# Patient Record
Sex: Female | Born: 2000 | Race: White | Hispanic: No | Marital: Single | Smoking: Current every day smoker
Health system: Southern US, Community
[De-identification: ages and names within clinical notes are randomized; demographics above are authoritative.]

## PROBLEM LIST (undated history)

## (undated) DIAGNOSIS — H6690 Otitis media, unspecified, unspecified ear: Secondary | ICD-10-CM

## (undated) DIAGNOSIS — T7421XA Adult sexual abuse, confirmed, initial encounter: Secondary | ICD-10-CM

## (undated) DIAGNOSIS — N390 Urinary tract infection, site not specified: Secondary | ICD-10-CM

## (undated) DIAGNOSIS — R6250 Unspecified lack of expected normal physiological development in childhood: Secondary | ICD-10-CM

## (undated) DIAGNOSIS — F909 Attention-deficit hyperactivity disorder, unspecified type: Secondary | ICD-10-CM

## (undated) DIAGNOSIS — F319 Bipolar disorder, unspecified: Secondary | ICD-10-CM

## (undated) DIAGNOSIS — B019 Varicella without complication: Secondary | ICD-10-CM

## (undated) DIAGNOSIS — J189 Pneumonia, unspecified organism: Secondary | ICD-10-CM

## (undated) DIAGNOSIS — R45851 Suicidal ideations: Secondary | ICD-10-CM

## (undated) DIAGNOSIS — F603 Borderline personality disorder: Secondary | ICD-10-CM

## (undated) DIAGNOSIS — J45909 Unspecified asthma, uncomplicated: Secondary | ICD-10-CM

## (undated) DIAGNOSIS — K219 Gastro-esophageal reflux disease without esophagitis: Secondary | ICD-10-CM

## (undated) DIAGNOSIS — J449 Chronic obstructive pulmonary disease, unspecified: Secondary | ICD-10-CM

## (undated) DIAGNOSIS — J020 Streptococcal pharyngitis: Secondary | ICD-10-CM

## (undated) DIAGNOSIS — R45850 Homicidal ideations: Secondary | ICD-10-CM

## (undated) DIAGNOSIS — F200 Paranoid schizophrenia: Secondary | ICD-10-CM

## (undated) DIAGNOSIS — F913 Oppositional defiant disorder: Secondary | ICD-10-CM

## (undated) DIAGNOSIS — F340 Cyclothymic disorder: Secondary | ICD-10-CM

## (undated) DIAGNOSIS — F1910 Other psychoactive substance abuse, uncomplicated: Secondary | ICD-10-CM

## (undated) DIAGNOSIS — F4310 Post-traumatic stress disorder, unspecified: Secondary | ICD-10-CM

## (undated) DIAGNOSIS — F429 Obsessive-compulsive disorder, unspecified: Secondary | ICD-10-CM

---
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
## 2019-08-04 DIAGNOSIS — F1193 Opioid use, unspecified with withdrawal: Secondary | ICD-10-CM | POA: Insufficient documentation

## 2019-08-04 DIAGNOSIS — F1123 Opioid dependence with withdrawal: Secondary | ICD-10-CM | POA: Insufficient documentation

## 2019-08-04 DIAGNOSIS — F1994 Other psychoactive substance use, unspecified with psychoactive substance-induced mood disorder: Secondary | ICD-10-CM | POA: Insufficient documentation

## 2020-08-15 ENCOUNTER — Other Ambulatory Visit: Payer: Self-pay

## 2020-08-15 ENCOUNTER — Ambulatory Visit (HOSPITAL_COMMUNITY)
Admission: RE | Admit: 2020-08-15 | Discharge: 2020-08-15 | Disposition: A | Discharge status: Home / Self Care | Payer: No Typology Code available for payment source | Attending: Psychiatry | Admitting: Psychiatry

## 2020-08-15 ENCOUNTER — Encounter (HOSPITAL_COMMUNITY): Payer: Self-pay | Admitting: Emergency Medicine

## 2020-08-15 ENCOUNTER — Emergency Department (HOSPITAL_COMMUNITY)
Admission: EM | Admit: 2020-08-15 | Discharge: 2020-08-18 | Disposition: A | Discharge status: Home / Self Care | Payer: No Typology Code available for payment source | Source: Home / Self Care | Attending: Emergency Medicine | Admitting: Emergency Medicine

## 2020-08-15 DIAGNOSIS — Y901 Blood alcohol level of 20-39 mg/100 ml: Secondary | ICD-10-CM | POA: Insufficient documentation

## 2020-08-15 DIAGNOSIS — F10229 Alcohol dependence with intoxication, unspecified: Secondary | ICD-10-CM | POA: Insufficient documentation

## 2020-08-15 DIAGNOSIS — Z20822 Contact with and (suspected) exposure to covid-19: Secondary | ICD-10-CM | POA: Insufficient documentation

## 2020-08-15 DIAGNOSIS — F29 Unspecified psychosis not due to a substance or known physiological condition: Secondary | ICD-10-CM

## 2020-08-15 DIAGNOSIS — F3113 Bipolar disorder, current episode manic without psychotic features, severe: Secondary | ICD-10-CM | POA: Insufficient documentation

## 2020-08-15 DIAGNOSIS — O0281 Inappropriate change in quantitative human chorionic gonadotropin (hCG) in early pregnancy: Secondary | ICD-10-CM | POA: Insufficient documentation

## 2020-08-15 DIAGNOSIS — F1910 Other psychoactive substance abuse, uncomplicated: Secondary | ICD-10-CM | POA: Clinically undetermined

## 2020-08-15 DIAGNOSIS — F1010 Alcohol abuse, uncomplicated: Secondary | ICD-10-CM

## 2020-08-15 DIAGNOSIS — R4689 Other symptoms and signs involving appearance and behavior: Secondary | ICD-10-CM

## 2020-08-15 DIAGNOSIS — F1019 Alcohol abuse with unspecified alcohol-induced disorder: Secondary | ICD-10-CM | POA: Diagnosis present

## 2020-08-15 LAB — COMPREHENSIVE METABOLIC PANEL
ALT: 31 U/L (ref 0–44)
AST: 26 U/L (ref 15–41)
Albumin: 3.9 g/dL (ref 3.5–5.0)
Alkaline Phosphatase: 59 U/L (ref 38–126)
Anion gap: 8 (ref 5–15)
BUN: 10 mg/dL (ref 6–20)
CO2: 23 mmol/L (ref 22–32)
Calcium: 8.9 mg/dL (ref 8.9–10.3)
Chloride: 115 mmol/L — ABNORMAL HIGH (ref 98–111)
Creatinine, Ser: 0.62 mg/dL (ref 0.44–1.00)
GFR, Estimated: >60 mL/min (ref >60)
Glucose, Bld: 101 mg/dL — ABNORMAL HIGH (ref 70–99)
Potassium: 3.4 mmol/L — ABNORMAL LOW (ref 3.5–5.1)
Sodium: 146 mmol/L — ABNORMAL HIGH (ref 135–145)
Total Bilirubin: 0.5 mg/dL (ref 0.3–1.2)
Total Protein: 6.9 g/dL (ref 6.5–8.1)

## 2020-08-15 LAB — CBC WITH DIFFERENTIAL/PLATELET
Abs Immature Granulocytes: 0.02 10*3/uL (ref 0.00–0.07)
Basophils Absolute: 0 10*3/uL (ref 0.0–0.1)
Basophils Relative: 0 %
Eosinophils Absolute: 0.1 10*3/uL (ref 0.0–0.5)
Eosinophils Relative: 2 %
HCT: 36.5 % (ref 36.0–46.0)
Hemoglobin: 12.2 g/dL (ref 12.0–15.0)
Immature Granulocytes: 0 %
Lymphocytes Relative: 55 %
Lymphs Abs: 3.9 10*3/uL (ref 0.7–4.0)
MCH: 32 pg (ref 26.0–34.0)
MCHC: 33.4 g/dL (ref 30.0–36.0)
MCV: 95.8 fL (ref 80.0–100.0)
Monocytes Absolute: 0.4 10*3/uL (ref 0.1–1.0)
Monocytes Relative: 5 %
Neutro Abs: 2.7 10*3/uL (ref 1.7–7.7)
Neutrophils Relative %: 38 %
Platelets: 295 10*3/uL (ref 150–400)
RBC: 3.81 MIL/uL — ABNORMAL LOW (ref 3.87–5.11)
RDW: 13.2 % (ref 11.5–15.5)
WBC: 7.1 10*3/uL (ref 4.0–10.5)
nRBC: 0 % (ref 0.0–0.2)

## 2020-08-15 LAB — RESP PANEL BY RT-PCR (FLU A&B, COVID) ARPGX2
Influenza A by PCR: NEGATIVE
Influenza B by PCR: NEGATIVE
SARS Coronavirus 2 by RT PCR: NEGATIVE

## 2020-08-15 LAB — CBG MONITORING, ED: Glucose-Capillary: 112 mg/dL — ABNORMAL HIGH (ref 70–99)

## 2020-08-15 LAB — I-STAT BETA HCG BLOOD, ED (MC, WL, AP ONLY): I-stat hCG, quantitative: 11.2 m[IU]/mL — ABNORMAL HIGH (ref <5)

## 2020-08-15 LAB — ETHANOL: Alcohol, Ethyl (B): 31 mg/dL — ABNORMAL HIGH (ref <10)

## 2020-08-15 MED ORDER — LORAZEPAM 2 MG/ML IJ SOLN
2.0000 mg | Freq: Once | INTRAMUSCULAR | Status: AC
  Administered 2020-08-15: 2 mg via INTRAMUSCULAR
  Filled 2020-08-15: qty 1

## 2020-08-15 MED ORDER — ZIPRASIDONE MESYLATE 20 MG IM SOLR
10.0000 mg | Freq: Once | INTRAMUSCULAR | Status: AC
  Administered 2020-08-15: 10 mg via INTRAMUSCULAR
  Filled 2020-08-15: qty 20
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
## 2020-08-15 NOTE — ED Provider Notes (Signed)
[facility]-EMERGENCY DEPT Provider Note   CSN: [id number] Arrival date & time: 08/15/20  2002     History Chief Complaint  Patient presents with   Psychiatric Evaluation   Alcohol Intoxication    Porfiria Alexandra Mcgee is a 20 y.o. female.  Patient comes from behavioral health for medical clearance given concern for polysubstance abuse, aggressive and erratic behavior.  Patient denies any pain.  She does not make much sense when I ask her questions or say things that are appropriate answers.  The history is provided by the patient.  Mental Health Problem Presenting symptoms: aggressive behavior and bizarre behavior   Degree of incapacity (severity):  Moderate Timing:  Constant Progression:  Unchanged Chronicity:  New Associated symptoms: no abdominal pain and no chest pain   Risk factors: no hx of mental illness       History reviewed. No pertinent past medical history.  There are no problems to display for this patient.   History reviewed. No pertinent surgical history.   OB History   No obstetric history on file.     No family history on file.     Home Medications Prior to Admission medications   Not on File    Allergies    Patient has no allergy information on record.  Review of Systems   Review of Systems  Constitutional:  Negative for chills and fever.  HENT:  Negative for ear pain and sore throat.   Eyes:  Negative for pain and visual disturbance.  Respiratory:  Negative for cough and shortness of breath.   Cardiovascular:  Negative for chest pain and palpitations.  Gastrointestinal:  Negative for abdominal pain and vomiting.  Genitourinary:  Negative for dysuria and hematuria.  Musculoskeletal:  Negative for arthralgias and back pain.  Skin:  Negative for color change and rash.  Neurological:  Negative for seizures and syncope.  All other systems reviewed and are negative.  Physical Exam Updated Vital Signs BP 113/77    Pulse 99   Temp 98.2 F (36.8 C)   Resp 17   Ht 5\' 1"  (1.549 m)   Wt 53 kg   SpO2 97%   BMI 22.07 kg/m   Physical Exam Vitals and nursing note reviewed.  Constitutional:      General: She is not in acute distress.    Appearance: She is well-developed. She is not ill-appearing.  HENT:     Head: Normocephalic and atraumatic.     Mouth/Throat:     Mouth: Mucous membranes are moist.  Eyes:     Extraocular Movements: Extraocular movements intact.     Conjunctiva/sclera: Conjunctivae normal.     Pupils: Pupils are equal, round, and reactive to light.  Cardiovascular:     Rate and Rhythm: Normal rate and regular rhythm.     Pulses: Normal pulses.     Heart sounds: Normal heart sounds. No murmur heard. Pulmonary:     Effort: Pulmonary effort is normal. No respiratory distress.     Breath sounds: Normal breath sounds.  Abdominal:     Palpations: Abdomen is soft.     Tenderness: There is no abdominal tenderness.  Musculoskeletal:     Cervical back: Neck supple.  Skin:    General: Skin is warm and dry.     Capillary Refill: Capillary refill takes less than 2 seconds.  Neurological:     General: No focal deficit present.     Mental Status: She is alert.  Psychiatric:  Attention and Perception: She is inattentive.        Mood and Affect: Affect is labile.        Speech: Speech is rapid and pressured.        Behavior: Behavior is agitated and aggressive.        Judgment: Judgment is impulsive.    ED Results / Procedures / Treatments   Labs (all labs ordered are listed, but only abnormal results are displayed) Labs Reviewed  COMPREHENSIVE METABOLIC PANEL - Abnormal; Notable for the following components:      Result Value   Sodium 146 (*)    Potassium 3.4 (*)    Chloride 115 (*)    Glucose, Bld 101 (*)    All other components within normal limits  ETHANOL - Abnormal; Notable for the following components:   Alcohol, Ethyl (B) 31 (*)    All other components within  normal limits  CBC WITH DIFFERENTIAL/PLATELET - Abnormal; Notable for the following components:   RBC 3.81 (*)    All other components within normal limits  CBG MONITORING, ED - Abnormal; Notable for the following components:   Glucose-Capillary 112 (*)    All other components within normal limits  I-STAT BETA HCG BLOOD, ED ([facility], [facility], [facility] ONLY) - Abnormal; Notable for the following components:   I-stat hCG, quantitative 11.2 (*)    All other components within normal limits  RESP PANEL BY RT-PCR (FLU A&B, COVID) ARPGX2  RAPID URINE DRUG SCREEN, HOSP PERFORMED    EKG None  Radiology No results found.  Procedures Procedures   Medications Ordered in ED Medications  ziprasidone (GEODON) injection 10 mg (10 mg Intramuscular Given 08/15/20 2132)  LORazepam (ATIVAN) injection 2 mg (2 mg Intramuscular Given 08/15/20 2136)    ED Course  I have reviewed the triage vital signs and the nursing notes.  Pertinent labs & imaging results that were available during my care of the patient were reviewed by me and considered in my medical decision making (see chart for details).    MDM Rules/Calculators/A&P                          Marianita Elley Harp is here for medical clearance after being seen by behavioral health.  Supposedly she was brought to behavioral health from a bar.  Per behavioral health note patient was brought in by someone saying that she was intoxicated and manic and restless with paranoid ideation.  Does not make much sense when she speaks.  She says a lot of paranoid things.  She overall appears intoxicated and she is agitated and aggressive.  She states that she has been diagnosed with multiple mental health problems in the past.  Overall it is difficult to get a history or physical from her.  She has been very combative and aggressive and will give Geodon and Ativan and get medical clearance labs including head CT.  Medical screening labs are overall unremarkable.  Pending head CT  prior to medical clearance.  This chart was dictated using voice recognition software.  Despite best efforts to proofread,  errors can occur which can change the documentation meaning.   Final Clinical Impression(s) / ED Diagnoses Final diagnoses:  Alcohol abuse  Aggressive behavior  Psychosis, unspecified psychosis type [facility])    Rx / DC Orders ED Discharge Orders     None        Virgina Norfolk, DO 08/16/20 0009
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
## 2020-08-15 NOTE — ED Notes (Signed)
Pt has been changed into [place] scrubs. Pt belongings bag have been put in the cabinet she has 3 bags total one pt belonging back and tote bag and a grey back pack. Keys and tablet are in the bags. Pt was been wand down by security.
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
## 2020-08-16 ENCOUNTER — Emergency Department (HOSPITAL_COMMUNITY): Payer: No Typology Code available for payment source

## 2020-08-16 DIAGNOSIS — F1019 Alcohol abuse with unspecified alcohol-induced disorder: Secondary | ICD-10-CM | POA: Diagnosis present

## 2020-08-16 DIAGNOSIS — F1910 Other psychoactive substance abuse, uncomplicated: Secondary | ICD-10-CM | POA: Clinically undetermined

## 2020-08-16 LAB — RAPID URINE DRUG SCREEN, HOSP PERFORMED
Amphetamines: NOT DETECTED
Barbiturates: NOT DETECTED
Benzodiazepines: NOT DETECTED
Cocaine: NOT DETECTED
Opiates: NOT DETECTED
Tetrahydrocannabinol: NOT DETECTED

## 2020-08-16 LAB — HCG, QUANTITATIVE, PREGNANCY: hCG, Beta Chain, Quant, S: <1 m[IU]/mL (ref <5)

## 2020-08-16 LAB — CBG MONITORING, ED: Glucose-Capillary: 100 mg/dL — ABNORMAL HIGH (ref 70–99)

## 2020-08-16 MED ORDER — ZIPRASIDONE MESYLATE 20 MG IM SOLR
20.0000 mg | INTRAMUSCULAR | Status: DC | PRN
  Administered 2020-08-16: 20 mg via INTRAMUSCULAR
  Filled 2020-08-16: qty 20

## 2020-08-16 MED ORDER — OLANZAPINE 5 MG PO TBDP
5.0000 mg | ORAL_TABLET | Freq: Three times a day (TID) | ORAL | Status: DC | PRN
  Administered 2020-08-16: 5 mg via ORAL
  Filled 2020-08-16: qty 1

## 2020-08-16 MED ORDER — CLONAZEPAM 1 MG PO TABS
1.0000 mg | ORAL_TABLET | Freq: Two times a day (BID) | ORAL | Status: DC | PRN
  Administered 2020-08-16 – 2020-08-18 (×2): 1 mg via ORAL
  Filled 2020-08-16: qty 1
  Filled 2020-08-16: qty 2

## 2020-08-16 MED ORDER — ONDANSETRON HCL 4 MG PO TABS: 4.0000 mg | ORAL_TABLET | Freq: Three times a day (TID) | ORAL | Status: DC | PRN

## 2020-08-16 MED ORDER — LORAZEPAM 1 MG PO TABS
1.0000 mg | ORAL_TABLET | ORAL | Status: AC | PRN
  Administered 2020-08-16: 1 mg via ORAL
  Filled 2020-08-16: qty 1

## 2020-08-16 MED ORDER — OLANZAPINE 5 MG PO TBDP
5.0000 mg | ORAL_TABLET | Freq: Two times a day (BID) | ORAL | Status: DC
  Administered 2020-08-17 – 2020-08-18 (×4): 5 mg via ORAL
  Filled 2020-08-16 (×4): qty 1

## 2020-08-16 MED ORDER — LORAZEPAM 1 MG PO TABS
1.0000 mg | ORAL_TABLET | ORAL | Status: AC | PRN
  Administered 2020-08-17: 1 mg via ORAL
  Filled 2020-08-16: qty 1

## 2020-08-16 MED ORDER — NICOTINE 21 MG/24HR TD PT24
21.0000 mg | MEDICATED_PATCH | Freq: Every day | TRANSDERMAL | Status: DC
  Administered 2020-08-16 – 2020-08-18 (×3): 21 mg via TRANSDERMAL
  Filled 2020-08-16 (×3): qty 1

## 2020-08-16 MED ORDER — OLANZAPINE 5 MG PO TBDP: 5.0000 mg | ORAL_TABLET | Freq: Three times a day (TID) | ORAL | Status: DC | PRN

## 2020-08-16 MED ORDER — ALUM & MAG HYDROXIDE-SIMETH 200-200-20 MG/5ML PO SUSP: 30.0000 mL | Freq: Four times a day (QID) | ORAL | Status: DC | PRN

## 2020-08-16 MED ORDER — GABAPENTIN 300 MG PO CAPS
300.0000 mg | ORAL_CAPSULE | Freq: Three times a day (TID) | ORAL | Status: DC
  Administered 2020-08-16 – 2020-08-18 (×6): 300 mg via ORAL
  Filled 2020-08-16 (×6): qty 1

## 2020-08-16 MED ORDER — ACETAMINOPHEN 325 MG PO TABS: 650.0000 mg | ORAL_TABLET | ORAL | Status: DC | PRN

## 2020-08-16 MED ORDER — STERILE WATER FOR INJECTION IJ SOLN
INTRAMUSCULAR | Status: AC
  Filled 2020-08-16: qty 10
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
## 2020-08-16 NOTE — ED Notes (Signed)
Pt ambulatory to the bathroom with steady gait.
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
## 2020-08-16 NOTE — ED Provider Notes (Signed)
20 year old female received a signout from Dr. Lockie Mola pending CT and TTS consult. Per her HPI:   "Alexandra Mcgee is a 20 y.o. female.   Patient comes from behavioral health for medical clearance given concern for polysubstance abuse, aggressive and erratic behavior.  Patient denies any pain.  She does not make much sense when I ask her questions or say things that are appropriate answers.   The history is provided by the patient.  Mental Health Problem Presenting symptoms: aggressive behavior and bizarre behavior   Degree of incapacity (severity):  Moderate Timing:  Constant Progression:  Unchanged Chronicity:  New Associated symptoms: no abdominal pain and no chest pain   Risk factors: no hx of mental illness"    Physical Exam  BP 113/77   Pulse 99   Temp 98.2 F (36.8 C)   Resp 17   Ht 5\' 1"  (1.549 m)   Wt 53 kg   SpO2 97%   BMI 22.07 kg/m   Physical Exam Vitals and nursing note reviewed.  Constitutional:      General: She is not in acute distress.    Comments: Sleeping, but arouses to voice  HENT:     Head: Normocephalic.  Eyes:     Conjunctiva/sclera: Conjunctivae normal.  Cardiovascular:     Rate and Rhythm: Normal rate and regular rhythm.     Pulses: Normal pulses.     Heart sounds: Normal heart sounds. No murmur heard.   No friction rub. No gallop.  Pulmonary:     Effort: Pulmonary effort is normal. No respiratory distress.     Breath sounds: No stridor. No wheezing, rhonchi or rales.  Chest:     Chest wall: No tenderness.  Abdominal:     General: There is no distension.     Palpations: Abdomen is soft.     Tenderness: There is no abdominal tenderness.  Musculoskeletal:     Cervical back: Neck supple.  Skin:    General: Skin is warm.     Findings: No rash.  Neurological:     Comments: Answers questions appropriately.  Psychiatric:        Behavior: Behavior normal.    ED Course/Procedures     Procedures  MDM   20 year old female  received at signout from Dr. 36 pending head CT and TTS consult.  Please see his note for further work-up and medical decision making.  In brief, this is a 20 year old female who presented from behavioral health for medical clearance after she was exhibiting aggressive and erratic behavior.  She was given Ativan and Geodon and placed in restraints.  When I reevaluated the patient at signout, restraint were no longer indicated.  Patient was sleeping, but when aroused she was not agitated or acting erratic.  Head CT is unremarkable.  UA is pending. Pt medically cleared at this time. Psych hold orders placed. TTS consult pending; please see psych team notes for further documentation of care/dispo. Pt stable at time of med clearance.        36 A, PA-C 08/16/20 10/17/20    0258, MD 08/16/20 [phone]
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
## 2020-08-16 NOTE — ED Notes (Signed)
Pt pacing back and forth in the hall, throwing things, spitting on the floor. Pt asked multiple times to remain in the bed. Pt unable to be redirected. Pt verbally abusing staff yelling various things like "fuck this", all y'all want to do is push" "I'm not staying"  20 mg Geodon given.

## 2020-08-16 NOTE — ED Notes (Signed)
Pt is becoming increasingly agittated stating "I just want my suboxone and clonopin" I'm sick and nobody is doing anything to help" 5 mg of Zyprexa administered per PRN order. MD made aware.

## 2020-08-16 NOTE — Progress Notes (Signed)
Per Leroy Sea, patient meets criteria for inpatient treatment. There are no available or appropriate beds at [facility] today. CSW faxed referrals to the following facilities for review:  [facility]  TTS will continue to seek bed placement.  Crissie Reese, MSW, LCSW-A, LCAS-A Phone: [phone] Disposition/TOC

## 2020-08-16 NOTE — ED Notes (Signed)
Pt out of bed pacing back and forth, pt easily redirectable at this time.

## 2020-08-16 NOTE — ED Notes (Signed)
Pt calm and cooperative at this time.

## 2020-08-16 NOTE — ED Notes (Signed)
Pt given meal tray.

## 2020-08-17 NOTE — ED Notes (Signed)
Patient asking for a paperback book.  Staff unable to locate.  Patient given crayons and a coloring book.  She is currently in her room coloring with TV on.  No distress noted.

## 2020-08-17 NOTE — ED Notes (Signed)
Pt has been eating and taking medications this shift with no issues.
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
## 2020-08-17 NOTE — ED Notes (Signed)
Patient calm and cooperative throughout the shift.

## 2020-08-17 NOTE — Progress Notes (Signed)
CSW returned a called from [facility] and inquired about a referral for this patient. It was reported that the referral was not received and for the referral to be resent. CSW resent the referral to [facility].  Crissie Reese, MSW, LCSW-A, LCAS-A Phone: [phone] Disposition/TOC

## 2020-08-17 NOTE — Progress Notes (Signed)
CSW was contacted by Yvetta Coder to inquire about a referral sent on this patient for placement. CSW connected [facility] staff to the patient's nurse who gave a report on the patient's recent behavior and administered medications for agitation. It was reported that due to the recent  severity of agitation there are no appropriate beds at this time.   Crissie Reese, MSW, LCSW-A, LCAS-A Phone: [phone] Disposition/TOC

## 2020-08-18 ENCOUNTER — Inpatient Hospital Stay (HOSPITAL_COMMUNITY)
Admission: AD | Admit: 2020-08-18 | Discharge: 2020-08-29 | DRG: 885 | Disposition: A | Discharge status: Home / Self Care | Payer: No Typology Code available for payment source | Source: Intra-hospital | Attending: Emergency Medicine | Admitting: Emergency Medicine

## 2020-08-18 ENCOUNTER — Other Ambulatory Visit: Payer: Self-pay

## 2020-08-18 ENCOUNTER — Encounter (HOSPITAL_COMMUNITY): Payer: Self-pay | Admitting: Emergency Medicine

## 2020-08-18 DIAGNOSIS — Z79899 Other long term (current) drug therapy: Secondary | ICD-10-CM | POA: Diagnosis not present

## 2020-08-18 DIAGNOSIS — Z20822 Contact with and (suspected) exposure to covid-19: Secondary | ICD-10-CM | POA: Diagnosis present

## 2020-08-18 DIAGNOSIS — Z8701 Personal history of pneumonia (recurrent): Secondary | ICD-10-CM

## 2020-08-18 DIAGNOSIS — R45850 Homicidal ideations: Secondary | ICD-10-CM | POA: Diagnosis present

## 2020-08-18 DIAGNOSIS — F3110 Bipolar disorder, current episode manic without psychotic features, unspecified: Secondary | ICD-10-CM | POA: Diagnosis present

## 2020-08-18 DIAGNOSIS — F1113 Opioid abuse with withdrawal: Secondary | ICD-10-CM | POA: Diagnosis present

## 2020-08-18 DIAGNOSIS — Z7984 Long term (current) use of oral hypoglycemic drugs: Secondary | ICD-10-CM | POA: Diagnosis not present

## 2020-08-18 DIAGNOSIS — F603 Borderline personality disorder: Secondary | ICD-10-CM | POA: Diagnosis present

## 2020-08-18 DIAGNOSIS — G4700 Insomnia, unspecified: Secondary | ICD-10-CM | POA: Diagnosis present

## 2020-08-18 DIAGNOSIS — R45851 Suicidal ideations: Secondary | ICD-10-CM | POA: Diagnosis present

## 2020-08-18 DIAGNOSIS — F1019 Alcohol abuse with unspecified alcohol-induced disorder: Secondary | ICD-10-CM | POA: Diagnosis present

## 2020-08-18 DIAGNOSIS — Z62810 Personal history of physical and sexual abuse in childhood: Secondary | ICD-10-CM | POA: Diagnosis present

## 2020-08-18 DIAGNOSIS — M25511 Pain in right shoulder: Secondary | ICD-10-CM | POA: Diagnosis not present

## 2020-08-18 DIAGNOSIS — W182XXA Fall in (into) shower or empty bathtub, initial encounter: Secondary | ICD-10-CM | POA: Diagnosis present

## 2020-08-18 DIAGNOSIS — F17210 Nicotine dependence, cigarettes, uncomplicated: Secondary | ICD-10-CM | POA: Diagnosis present

## 2020-08-18 DIAGNOSIS — R109 Unspecified abdominal pain: Secondary | ICD-10-CM | POA: Diagnosis not present

## 2020-08-18 DIAGNOSIS — F429 Obsessive-compulsive disorder, unspecified: Secondary | ICD-10-CM | POA: Diagnosis present

## 2020-08-18 DIAGNOSIS — I951 Orthostatic hypotension: Secondary | ICD-10-CM | POA: Diagnosis present

## 2020-08-18 DIAGNOSIS — J449 Chronic obstructive pulmonary disease, unspecified: Secondary | ICD-10-CM | POA: Diagnosis present

## 2020-08-18 DIAGNOSIS — B353 Tinea pedis: Secondary | ICD-10-CM | POA: Diagnosis present

## 2020-08-18 DIAGNOSIS — Z781 Physical restraint status: Secondary | ICD-10-CM

## 2020-08-18 DIAGNOSIS — S80211A Abrasion, right knee, initial encounter: Secondary | ICD-10-CM | POA: Diagnosis not present

## 2020-08-18 DIAGNOSIS — S80212A Abrasion, left knee, initial encounter: Secondary | ICD-10-CM | POA: Diagnosis not present

## 2020-08-18 DIAGNOSIS — R519 Headache, unspecified: Secondary | ICD-10-CM | POA: Diagnosis not present

## 2020-08-18 DIAGNOSIS — Y92231 Patient bathroom in hospital as the place of occurrence of the external cause: Secondary | ICD-10-CM | POA: Diagnosis not present

## 2020-08-18 DIAGNOSIS — F1994 Other psychoactive substance use, unspecified with psychoactive substance-induced mood disorder: Secondary | ICD-10-CM | POA: Diagnosis present

## 2020-08-18 DIAGNOSIS — F913 Oppositional defiant disorder: Secondary | ICD-10-CM | POA: Diagnosis present

## 2020-08-18 DIAGNOSIS — M25531 Pain in right wrist: Secondary | ICD-10-CM | POA: Diagnosis present

## 2020-08-18 DIAGNOSIS — R52 Pain, unspecified: Secondary | ICD-10-CM

## 2020-08-18 DIAGNOSIS — R102 Pelvic and perineal pain: Secondary | ICD-10-CM | POA: Diagnosis not present

## 2020-08-18 DIAGNOSIS — F10129 Alcohol abuse with intoxication, unspecified: Secondary | ICD-10-CM | POA: Diagnosis present

## 2020-08-18 DIAGNOSIS — Z9151 Personal history of suicidal behavior: Secondary | ICD-10-CM

## 2020-08-18 DIAGNOSIS — F909 Attention-deficit hyperactivity disorder, unspecified type: Secondary | ICD-10-CM | POA: Diagnosis present

## 2020-08-18 DIAGNOSIS — Z888 Allergy status to other drugs, medicaments and biological substances status: Secondary | ICD-10-CM

## 2020-08-18 DIAGNOSIS — J45909 Unspecified asthma, uncomplicated: Secondary | ICD-10-CM | POA: Diagnosis not present

## 2020-08-18 DIAGNOSIS — R296 Repeated falls: Secondary | ICD-10-CM | POA: Diagnosis present

## 2020-08-18 DIAGNOSIS — F1910 Other psychoactive substance abuse, uncomplicated: Secondary | ICD-10-CM | POA: Diagnosis present

## 2020-08-18 DIAGNOSIS — R000 Tachycardia, unspecified: Secondary | ICD-10-CM | POA: Diagnosis not present

## 2020-08-18 DIAGNOSIS — R079 Chest pain, unspecified: Secondary | ICD-10-CM | POA: Diagnosis not present

## 2020-08-18 DIAGNOSIS — F4310 Post-traumatic stress disorder, unspecified: Secondary | ICD-10-CM | POA: Diagnosis present

## 2020-08-18 DIAGNOSIS — F312 Bipolar disorder, current episode manic severe with psychotic features: Secondary | ICD-10-CM | POA: Diagnosis present

## 2020-08-18 DIAGNOSIS — Z818 Family history of other mental and behavioral disorders: Secondary | ICD-10-CM

## 2020-08-18 DIAGNOSIS — S8991XA Unspecified injury of right lower leg, initial encounter: Secondary | ICD-10-CM | POA: Diagnosis present

## 2020-08-18 DIAGNOSIS — N9489 Other specified conditions associated with female genital organs and menstrual cycle: Secondary | ICD-10-CM | POA: Diagnosis not present

## 2020-08-18 DIAGNOSIS — F200 Paranoid schizophrenia: Secondary | ICD-10-CM | POA: Diagnosis present

## 2020-08-18 DIAGNOSIS — Z79890 Hormone replacement therapy (postmenopausal): Secondary | ICD-10-CM | POA: Diagnosis not present

## 2020-08-18 DIAGNOSIS — Z87440 Personal history of urinary (tract) infections: Secondary | ICD-10-CM

## 2020-08-18 DIAGNOSIS — M542 Cervicalgia: Secondary | ICD-10-CM | POA: Diagnosis not present

## 2020-08-18 HISTORY — DX: Other psychoactive substance abuse, uncomplicated: F19.10

## 2020-08-18 HISTORY — DX: Chronic obstructive pulmonary disease, unspecified: J44.9

## 2020-08-18 HISTORY — DX: Gastro-esophageal reflux disease without esophagitis: K21.9

## 2020-08-18 HISTORY — DX: Post-traumatic stress disorder, unspecified: F43.10

## 2020-08-18 HISTORY — DX: Borderline personality disorder: F60.3

## 2020-08-18 HISTORY — DX: Attention-deficit hyperactivity disorder, unspecified type: F90.9

## 2020-08-18 HISTORY — DX: Homicidal ideations: R45.850

## 2020-08-18 HISTORY — DX: Unspecified lack of expected normal physiological development in childhood: R62.50

## 2020-08-18 HISTORY — DX: Urinary tract infection, site not specified: N39.0

## 2020-08-18 HISTORY — DX: Cyclothymic disorder: F34.0

## 2020-08-18 HISTORY — DX: Pneumonia, unspecified organism: J18.9

## 2020-08-18 HISTORY — DX: Varicella without complication: B01.9

## 2020-08-18 HISTORY — DX: Otitis media, unspecified, unspecified ear: H66.90

## 2020-08-18 HISTORY — DX: Suicidal ideations: R45.851

## 2020-08-18 HISTORY — DX: Bipolar disorder, unspecified: F31.9

## 2020-08-18 HISTORY — DX: Unspecified asthma, uncomplicated: J45.909

## 2020-08-18 HISTORY — DX: Paranoid schizophrenia: F20.0

## 2020-08-18 HISTORY — DX: Adult sexual abuse, confirmed, initial encounter: T74.21XA

## 2020-08-18 HISTORY — DX: Oppositional defiant disorder: F91.3

## 2020-08-18 HISTORY — DX: Streptococcal pharyngitis: J02.0

## 2020-08-18 HISTORY — DX: Obsessive-compulsive disorder, unspecified: F42.9

## 2020-08-18 MED ORDER — GABAPENTIN 300 MG PO CAPS
300.0000 mg | ORAL_CAPSULE | Freq: Three times a day (TID) | ORAL | Status: DC
  Administered 2020-08-18 – 2020-08-28 (×30): 300 mg via ORAL
  Filled 2020-08-18 (×43): qty 1

## 2020-08-18 MED ORDER — HYDROXYZINE HCL 25 MG PO TABS
25.0000 mg | ORAL_TABLET | Freq: Three times a day (TID) | ORAL | Status: DC | PRN
  Administered 2020-08-18 – 2020-08-28 (×14): 25 mg via ORAL
  Filled 2020-08-18 (×16): qty 1

## 2020-08-18 MED ORDER — ACETAMINOPHEN 325 MG PO TABS
650.0000 mg | ORAL_TABLET | Freq: Four times a day (QID) | ORAL | Status: DC | PRN
  Administered 2020-08-20 – 2020-08-26 (×2): 650 mg via ORAL
  Filled 2020-08-18 (×2): qty 2

## 2020-08-18 MED ORDER — OLANZAPINE 5 MG PO TBDP
5.0000 mg | ORAL_TABLET | Freq: Two times a day (BID) | ORAL | Status: DC
  Administered 2020-08-18 – 2020-08-21 (×6): 5 mg via ORAL
  Filled 2020-08-18 (×11): qty 1

## 2020-08-18 MED ORDER — MAGNESIUM HYDROXIDE 400 MG/5ML PO SUSP: 30.0000 mL | Freq: Every day | ORAL | Status: DC | PRN

## 2020-08-18 MED ORDER — ALBUTEROL SULFATE HFA 108 (90 BASE) MCG/ACT IN AERS
2.0000 | INHALATION_SPRAY | RESPIRATORY_TRACT | Status: DC | PRN
  Administered 2020-08-18: 2 via RESPIRATORY_TRACT
  Filled 2020-08-18: qty 6.7

## 2020-08-18 MED ORDER — ALBUTEROL SULFATE HFA 108 (90 BASE) MCG/ACT IN AERS
2.0000 | INHALATION_SPRAY | RESPIRATORY_TRACT | Status: DC | PRN
  Administered 2020-08-18 – 2020-08-20 (×5): 2 via RESPIRATORY_TRACT
  Filled 2020-08-18 (×2): qty 6.7

## 2020-08-18 MED ORDER — ALUM & MAG HYDROXIDE-SIMETH 200-200-20 MG/5ML PO SUSP: 30.0000 mL | ORAL | Status: DC | PRN

## 2020-08-18 MED ORDER — CLONAZEPAM 0.5 MG PO TBDP
1.0000 mg | ORAL_TABLET | Freq: Two times a day (BID) | ORAL | Status: DC | PRN
  Administered 2020-08-19: 1 mg via ORAL
  Filled 2020-08-18: qty 2

## 2020-08-18 MED ORDER — NICOTINE 21 MG/24HR TD PT24
21.0000 mg | MEDICATED_PATCH | Freq: Every day | TRANSDERMAL | Status: DC
  Administered 2020-08-19 – 2020-08-29 (×11): 21 mg via TRANSDERMAL
  Filled 2020-08-18 (×18): qty 1

## 2020-08-18 MED ORDER — TRAZODONE HCL 50 MG PO TABS
50.0000 mg | ORAL_TABLET | Freq: Every evening | ORAL | Status: DC | PRN
  Administered 2020-08-18 – 2020-08-25 (×8): 50 mg via ORAL
  Filled 2020-08-18 (×9): qty 1

## 2020-08-18 NOTE — Progress Notes (Signed)
BHH Group Notes:  (Nursing/MHT/Case Management/Adjunct)  Date:  08/18/2020  Time:  2015  Type of Therapy:   wrap up group  Participation Level:  Active  Participation Quality:  Appropriate, Attentive, Sharing, and Supportive  Affect:  Appropriate  Cognitive:  Alert  Insight:  Improving  Engagement in Group:  Engaged  Modes of Intervention:  Clarification, Education, and Support  Summary of Progress/Problems: Positive thinking and positive change were discussed.   Alexandra Mcgee 08/18/2020, 9:01 PM

## 2020-08-18 NOTE — ED Provider Notes (Addendum)
Emergency Medicine Observation Re-evaluation Note  Alexandra Mcgee is a 20 y.o. female, seen on rounds today.  Pt initially presented to the ED for complaints of Psychiatric Evaluation and Alcohol Intoxication Currently, the patient is Sitting at bedside coloring/drawing.  Physical Exam  BP 119/75 (BP Location: Right Arm)   Pulse (!) 107   Temp 98.5 F (36.9 C) (Oral)   Resp 18   Ht 5\' 1"  (1.549 m)   Wt 53 kg   SpO2 98%   BMI 22.07 kg/m  Physical Exam General: No distress Lungs: Resp even and unlabored Psych: Calm and cooperative  ED Course / MDM  EKG:EKG Interpretation  Date/Time:  Saturday August 16 2020 03:34:53 EDT Ventricular Rate:  69 PR Interval:  124 QRS Duration: 86 QT Interval:  384 QTC Calculation: 411 R Axis:   70 Text Interpretation: Sinus rhythm with marked sinus arrhythmia Otherwise normal ECG Confirmed by [id number] (Geoffery Lyons) on 08/16/2020 5:20:41 AM  I have reviewed the labs performed to date as well as medications administered while in observation.  Recent changes in the last 24 hours include none.  Plan  Current plan is for inpatient psych admission. Patient is under full IVC at this time.   12:32 PM Patient accepted for admission to [facility]    [facility], MD 08/18/20 1232
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
## 2020-08-18 NOTE — ED Notes (Signed)
Patient's belongings placed in locker 27.  Patient given 1 paperback book from her belongings.

## 2020-08-18 NOTE — Progress Notes (Signed)
08/18/2020  1035  Patient has been up pacing the floor since 0700.
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
## 2020-08-18 NOTE — BHH Group Notes (Signed)
Occupational Therapy Group Note Date: 08/18/2020 Group Topic/Focus: Feelings Management  Group Description: Group encouraged increased engagement and participation through discussion focused on Building Happiness. Patients were provided a handout and reviewed therapeutic strategies to build happiness including identifying gratitudes, random acts of kindness, exercise, meditation, positive journaling, and fostering relationships. Patients engaged in discussion and encouraged to reflect on each strategy and their experiences.  Therapeutic Goal(s): Identify strategies to build happiness. Identify and implement therapeutic strategies to improve overall mood. Practice and identify gratitudes, random acts of kindness, exercise, meditation, positive journaling, and fostering relationships Participation Level: Moderate   Participation Quality: Independent   Behavior: Cooperative and Interactive   Speech/Thought Process: Disorganized   Affect/Mood: Full range   Insight: Limited   Judgement: Limited   Individualization: Adaira was moderately engaged in their participation of group discussion/activity. Pt left once for unknown reasons and when she returned, asked to say a prayer for the group. Pt then asked this writer for a hair tie; disorganized in her presentation and difficult to follow.   Modes of Intervention: Activity, Discussion, and Education  Patient Response to Interventions:  Challenging   Plan: Continue to engage patient in OT groups 2 - 3x/week.  08/18/2020  Donne Hazel, MOT, OTR/L

## 2020-08-18 NOTE — Tx Team (Signed)
Initial Treatment Plan 08/18/2020 3:00 PM Alexandra Mcgee JOI:[id number]    PATIENT STRESSORS: Financial difficulties Health problems Legal issue Substance abuse   PATIENT STRENGTHS: Barrister's clerk for treatment/growth   PATIENT IDENTIFIED PROBLEMS: Suicidal thoughts  Substance Abuse  Hallucinations  Homelessness               DISCHARGE CRITERIA:  Motivation to continue treatment in a less acute level of care Verbal commitment to aftercare and medication compliance  PRELIMINARY DISCHARGE PLAN: Outpatient therapy Placement in alternative living arrangements  PATIENT/FAMILY INVOLVEMENT: This treatment plan has been presented to and reviewed with the patient, Alexandra Mcgee, and/or family member.  The patient and family have been given the opportunity to ask questions and make suggestions.  Audrie Lia Solmon Bohr, RN 08/18/2020, 3:00 PM

## 2020-08-18 NOTE — BH Assessment (Addendum)
[facility] Assessment Progress Note   Per Dorena Bodo, NP, this pt requires psychiatric hospitalization.  Linsey, RN, [facility] has assigned pt to [facility] Rm [id number] to the service of Dr Mason Jim.  Pt presents under IVC initiated by EDP Virgina Norfolk, DO and IVC documents have been faxed to [facility].  EDP Susy Frizzle, MD and pt's nurse, Addison Naegeli, have been notified, and Addison Naegeli agrees to call report to [phone].  Pt is to be transported via Patent examiner.   Doylene Canning, [place] Behavioral Health Coordinator [phone]
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
## 2020-08-19 DIAGNOSIS — F3110 Bipolar disorder, current episode manic without psychotic features, unspecified: Secondary | ICD-10-CM | POA: Diagnosis not present

## 2020-08-19 LAB — LIPID PANEL
Cholesterol: 154 mg/dL (ref 0–200)
HDL: 53 mg/dL (ref >40)
LDL Cholesterol: 53 mg/dL (ref 0–99)
Total CHOL/HDL Ratio: 2.9 RATIO
Triglycerides: 238 mg/dL — ABNORMAL HIGH (ref <150)
VLDL: 48 mg/dL — ABNORMAL HIGH (ref 0–40)

## 2020-08-19 LAB — HEMOGLOBIN A1C
Hgb A1c MFr Bld: 5.6 % (ref 4.8–5.6)
Mean Plasma Glucose: 114.02 mg/dL

## 2020-08-19 LAB — TSH: TSH: 0.887 u[IU]/mL (ref 0.350–4.500)

## 2020-08-19 MED ORDER — WHITE PETROLATUM EX OINT
TOPICAL_OINTMENT | CUTANEOUS | Status: AC
  Administered 2020-08-19: 1
  Filled 2020-08-19: qty 5

## 2020-08-19 MED ORDER — LORAZEPAM 1 MG PO TABS: 1.0000 mg | ORAL_TABLET | Freq: Four times a day (QID) | ORAL | Status: DC | PRN

## 2020-08-19 MED ORDER — CLONAZEPAM 1 MG PO TABS: 1.0000 mg | ORAL_TABLET | Freq: Two times a day (BID) | ORAL | Status: DC | PRN

## 2020-08-19 MED ORDER — WHITE PETROLATUM EX OINT
TOPICAL_OINTMENT | CUTANEOUS | Status: AC
  Filled 2020-08-19: qty 5

## 2020-08-19 MED ORDER — FOLIC ACID 1 MG PO TABS
1.0000 mg | ORAL_TABLET | Freq: Every day | ORAL | Status: DC
  Administered 2020-08-19 – 2020-08-29 (×11): 1 mg via ORAL
  Filled 2020-08-19 (×17): qty 1

## 2020-08-19 NOTE — Progress Notes (Signed)
Recreation Therapy Notes  Animal-Assisted Activity (AAA) Program Checklist/Progress Notes Patient Eligibility Criteria Checklist & Daily Group note for Rec Tx Intervention  Date: 7.12.22 Time: 1420 Location: [address]  AAA/T Program Assumption of Risk Form signed by Engineer, production or Parent Legal Guardian  YES   Patient is free of allergies or severe asthma  YES  Patient reports no fear of animals  YES  Patient reports no history of cruelty to animals YES  Patient understands his/her participation is voluntary  YES  Patient washes hands before animal contact  YES  Patient washes hands after animal contact  YES  Behavioral Response: Engaged  Education: Charity fundraiser, Appropriate Animal Interaction   Education Outcome: Acknowledges understanding/In group clarification offered/Needs additional education.   Clinical Observations/Feedback:  Pt attended and participated during group activity.     Caroll Rancher, LRT/CTRS        Caroll Rancher A 08/19/2020 3:15 PM

## 2020-08-19 NOTE — Plan of Care (Signed)
Nurse discussed anxiety, depression and coping skills with patient.  

## 2020-08-19 NOTE — BHH Group Notes (Signed)
Adult Psychoeducational Group Note  Date:  08/19/2020 Time:  9:43 PM  Group Topic/Focus:  Managing Feelings:   The focus of this group is to identify what feelings patients have difficulty handling and develop a plan to handle them in a healthier way upon discharge. Overcoming Stress:   The focus of this group is to define stress and help patients assess their triggers.  Participation Level:  Active  Participation Quality:  Attentive  Affect:  Appropriate  Cognitive:  Appropriate  Insight: Good  Engagement in Group:  Limited  Modes of Intervention:  Discussion  Additional Comments  Jacalyn Lefevre 08/19/2020, 9:43 PM
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
## 2020-08-19 NOTE — Progress Notes (Signed)
D:  Patient;s self inventory sheet, patient sleeps good, sleep medication helpful.  Good appetite, hyper energy level, poor concentration.  Rated depression and hopeless 6, anxiety 7.  Denied withdrawals.  Denied SI.  Denied physical problems.  Denied physical pain.  Goal is stay positive.  Stay away from negative.  No discharge plans. A:  Medications administered per MD orders.  Emotional support and encouragement given patient. R:  Denied SI and HI, contracts for safety.  Denied A/V hallucinations.  Safety maintained with 15 minute checks.
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
## 2020-08-19 NOTE — Progress Notes (Signed)
Adult Psychoeducational Group Note  Date:  08/19/2020 Time:  2:56 PM  Group Topic/Focus:  Healthy Communication:   The focus of this group is to discuss communication, barriers to communication, as well as healthy ways to communicate with others.  Participation Level:  Active  Participation Quality:  Appropriate and Attentive  Affect:  Appropriate  Cognitive:  Appropriate  Insight: Appropriate and Good  Engagement in Group:  Engaged  Modes of Intervention:  Discussion  Additional Comments:  Pt attended the healthy communication group this afternoon.   D[employer]les Britne Borelli 08/19/2020, 2:56 PM

## 2020-08-19 NOTE — Progress Notes (Signed)
Patient highly concerned over a tick bite that occurred over a week ago and is now turning purple and blue.  Look like it is going to be infected soon.  And she is also worried about lyme's disease.  R foot between big toe and middle toe.

## 2020-08-19 NOTE — Progress Notes (Signed)
   08/18/20 2200  Psych Admission Type (Psych Patients Only)  Admission Status Involuntary  Psychosocial Assessment  Patient Complaints Anxiety  Eye Contact Fair  Facial Expression Anxious  Affect Appropriate to circumstance  Speech Logical/coherent  Interaction Assertive  Motor Activity Fidgety  Appearance/Hygiene Unremarkable  Behavior Characteristics Cooperative  Mood Pleasant  Thought Process  Coherency WDL  Content WDL  Delusions None reported or observed  Perception WDL  Hallucination None reported or observed  Judgment Poor  Confusion None  Danger to Self  Current suicidal ideation? Denies  Danger to Others  Danger to Others None reported or observed

## 2020-08-19 NOTE — Progress Notes (Signed)
Adult Psychoeducational Group Note  Date:  08/19/2020 Time:  1:38 PM  Group Topic/Focus:  Goals Group:   The focus of this group is to help patients establish daily goals to achieve during treatment and discuss how the patient can incorporate goal setting into their daily lives to aide in recovery.  Participation Level:  Did Not Attend Alexandra Mcgee 08/19/2020, 1:38 PM
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
## 2020-08-20 ENCOUNTER — Encounter (HOSPITAL_COMMUNITY): Payer: Self-pay | Admitting: Family Medicine

## 2020-08-20 MED ORDER — LORAZEPAM 1 MG PO TABS
1.0000 mg | ORAL_TABLET | Freq: Four times a day (QID) | ORAL | Status: DC | PRN
  Administered 2020-08-21: 1 mg via ORAL
  Filled 2020-08-20: qty 1

## 2020-08-20 MED ORDER — WHITE PETROLATUM EX OINT
TOPICAL_OINTMENT | CUTANEOUS | Status: AC
  Filled 2020-08-20: qty 5

## 2020-08-20 MED ORDER — OLANZAPINE 5 MG PO TBDP
5.0000 mg | ORAL_TABLET | ORAL | Status: AC
  Administered 2020-08-20: 5 mg via ORAL

## 2020-08-20 MED ORDER — HALOPERIDOL LACTATE 5 MG/ML IJ SOLN
10.0000 mg | Freq: Four times a day (QID) | INTRAMUSCULAR | Status: DC | PRN
  Administered 2020-08-21: 10 mg via INTRAMUSCULAR
  Filled 2020-08-20: qty 2

## 2020-08-20 MED ORDER — OLANZAPINE 10 MG PO TBDP
10.0000 mg | ORAL_TABLET | Freq: Three times a day (TID) | ORAL | Status: DC | PRN
  Administered 2020-08-20 – 2020-08-24 (×4): 10 mg via ORAL
  Filled 2020-08-20 (×3): qty 1

## 2020-08-20 MED ORDER — CLOTRIMAZOLE 1 % EX CREA
TOPICAL_CREAM | Freq: Two times a day (BID) | CUTANEOUS | Status: DC
  Administered 2020-08-21 – 2020-08-28 (×7): 1 via TOPICAL
  Filled 2020-08-20: qty 15

## 2020-08-20 MED ORDER — BACITRACIN-NEOMYCIN-POLYMYXIN OINTMENT TUBE
TOPICAL_OINTMENT | Freq: Two times a day (BID) | CUTANEOUS | Status: DC
  Administered 2020-08-21 – 2020-08-26 (×9): 1 via TOPICAL
  Filled 2020-08-20: qty 14.17

## 2020-08-20 MED ORDER — MUPIROCIN CALCIUM 2 % EX CREA
TOPICAL_CREAM | Freq: Two times a day (BID) | CUTANEOUS | Status: DC
  Administered 2020-08-21 – 2020-08-28 (×8): 1 via TOPICAL
  Filled 2020-08-20: qty 15

## 2020-08-20 NOTE — [facility] Group Notes (Signed)
Type of Therapy and Topic:  Group Therapy:  Positive Affirmations   Participation Level:  Active   Description of Group: This group addressed positive affirmation toward self and others. Patients went around the room and identified two positive things about themselves and two positive things about a peer in the room. Patients reflected on how it felt to share something positive with others, to identify positive things about themselves, and to hear positive things from others. Patients were encouraged to have a daily reflection of positive characteristics or circumstances. Therapeutic Goals Patient will verbalize two of their positive qualities Patient will demonstrate empathy for others by stating two positive qualities about a peer in the group Patient will verbalize their feelings when voicing positive self affirmations and when voicing positive affirmations of others Patients will discuss the potential positive impact on their wellness/recovery of focusing on positive traits of self and others.   Summary of Patient Progress: Due to the acuity and complex discharge plans, group was not held. Patient was provided therapeutic worksheets and asked to meet with CSW as needed. 

## 2020-08-20 NOTE — Progress Notes (Signed)
Recreation Therapy Notes  Date: 7.13.22 Time: 1000 Location: 500 Hall Dayroom  Group Topic: Communication  Goal Area(s) Addresses:  Patient will effectively communicate with peers in group.  Patient will verbalize benefit of healthy communication. Patient will verbalize positive effect of healthy communication on post d/c goals.   Behavioral Response: None  Intervention: Geometrical Drawings, Pencils, Blank Paper  Activity: LRT and patients talked about the various ways (cell phone, texting, e-mail, body language, eye contact, etc) in which people communicate with each other. Patients also talked about how communication can affect different areas of our lives.  Three volunteers would come to the front of the room one at a time.  Each person was given a picture to describe to the rest of the group.  The presenters were to be as descriptive as possible and the listeners could only ask for the presenter to repeat themselves.  They could not ask any descriptive questions.    Education: Communication, Discharge Planning  Education Outcome: Acknowledges understanding/In group clarification offered/Needs additional education.   Clinical Observations/Feedback: Pt came into group long enough to get the instructions.  Pt left and did not return.       Caroll Rancher, LRT/CTRS        Lillia Abed, Neema Barreira A 08/20/2020 1:20 PM

## 2020-08-20 NOTE — Progress Notes (Signed)
Recreation Therapy Notes  Date:  7.13.22 Time: 0930 Location: 300 Hall Dayroom  Group Topic: Stress Management  Goal Area(s) Addresses:  Patient will identify positive stress management techniques. Patient will identify benefits of using stress management post d/c.  Intervention: Stress Management  Activity :  Meditation is used to calm the patient to center their attention on breathing techniques to relax.  Patients are sit quietly and follow along with the meditation as it plays to become more and more relaxed with each breath to feel at peace.  Education:  Stress Management, Discharge Planning.   Education Outcome: Acknowledges Education  Clinical Observations/Feedback: Despite invitation to group, pt did not attend group session.     Caroll Rancher, LRT/CTRS         Caroll Rancher A 08/20/2020 12:42 PM

## 2020-08-20 NOTE — Tx Team (Signed)
Interdisciplinary Treatment and Diagnostic Plan Update  08/20/2020 Time of Session: 9:00am  Alexandra Mcgee MRN: [id number]  Principal Diagnosis: <principal problem not specified>  Secondary Diagnoses: Active Problems:   Bipolar I disorder with mania ([place])   Current Medications:  Current Facility-Administered Medications  Medication Dose Route Frequency Provider Last Rate Last Admin   acetaminophen (TYLENOL) tablet 650 mg  650 mg Oral Q6H PRN Chalmers Guest, NP   650 mg at 08/20/20 0028   albuterol (VENTOLIN HFA) 108 (90 Base) MCG/ACT inhaler 2 puff  2 puff Inhalation Q4H PRN Chalmers Guest, NP   2 puff at 08/20/20 0741   alum & mag hydroxide-simeth (MAALOX/MYLANTA) 200-200-20 MG/5ML suspension 30 mL  30 mL Oral Q4H PRN Chalmers Guest, NP       folic acid (FOLVITE) tablet 1 mg  1 mg Oral Daily Merrily Brittle, DO   1 mg at 08/20/20 0746   gabapentin (NEURONTIN) capsule 300 mg  300 mg Oral TID Chalmers Guest, NP   300 mg at 08/20/20 1127   hydrOXYzine (ATARAX/VISTARIL) tablet 25 mg  25 mg Oral TID PRN Chalmers Guest, NP   25 mg at 08/20/20 0749   LORazepam (ATIVAN) tablet 1 mg  1 mg Oral Q6H PRN Sharma Covert, MD       magnesium hydroxide (MILK OF MAGNESIA) suspension 30 mL  30 mL Oral Daily PRN Chalmers Guest, NP       nicotine (NICODERM CQ - dosed in mg/24 hours) patch 21 mg  21 mg Transdermal Q0600 Chalmers Guest, NP   21 mg at 08/20/20 0743   OLANZapine zydis (ZYPREXA) disintegrating tablet 5 mg  5 mg Oral BID Chalmers Guest, NP   5 mg at 08/20/20 0745   traZODone (DESYREL) tablet 50 mg  50 mg Oral QHS PRN Chalmers Guest, NP   50 mg at 08/20/20 0028   PTA Medications: Medications Prior to Admission  Medication Sig Dispense Refill Last Dose   albuterol (VENTOLIN HFA) 108 (90 Base) MCG/ACT inhaler Inhale 1 puff into the lungs every 6 (six) hours as needed for wheezing or shortness of breath.      cariprazine (VRAYLAR) 3 MG capsule Take 3 mg by mouth daily. LF 2020 per [facility] (Patient not taking: Reported on 08/18/2020)   Not Taking   clonazePAM (KLONOPIN) 1 MG tablet Take 1.5 mg by mouth 2 (two) times daily. LF 2020 per [facility] in [place]      ibuprofen (ADVIL) 200 MG tablet Take 400 mg by mouth every 6 (six) hours as needed for mild pain or headache.      Melatonin 10 MG TABS Take 10 mg by mouth at bedtime as needed (sleep). (Patient not taking: Reported on 08/18/2020)   Not Taking   metFORMIN (GLUCOPHAGE-XR) 500 MG 24 hr tablet Take 500 mg by mouth 2 (two) times daily. (Patient not taking: Reported on 08/18/2020)   Not Taking    Patient Stressors: Financial difficulties Health problems Legal issue Substance abuse  Patient Strengths: Agricultural engineer for treatment/growth  Treatment Modalities: Medication Management, Group therapy, Case management,  1 to 1 session with clinician, Psychoeducation, Recreational therapy.   Physician Treatment Plan for Primary Diagnosis: <principal problem not specified> Long Term Goal(s): Improvement in symptoms so as ready for discharge   Short Term Goals: Ability to identify changes in lifestyle to reduce recurrence of condition will improve Ability to verbalize feelings will improve Ability to  disclose and discuss suicidal ideas Ability to demonstrate self-control will improve Ability to identify and develop effective coping behaviors will improve Ability to maintain clinical measurements within normal limits will improve Compliance with prescribed medications will improve Ability to identify triggers associated with substance abuse/mental health issues will improve  Medication Management: Evaluate patient's response, side effects, and tolerance of medication regimen.  Therapeutic Interventions: 1 to 1 sessions, Unit Group sessions and Medication administration.  Evaluation of Outcomes: Not Met  Physician Treatment Plan for Secondary Diagnosis: Active Problems:   Bipolar I  disorder with mania ([place])  Long Term Goal(s): Improvement in symptoms so as ready for discharge   Short Term Goals: Ability to identify changes in lifestyle to reduce recurrence of condition will improve Ability to verbalize feelings will improve Ability to disclose and discuss suicidal ideas Ability to demonstrate self-control will improve Ability to identify and develop effective coping behaviors will improve Ability to maintain clinical measurements within normal limits will improve Compliance with prescribed medications will improve Ability to identify triggers associated with substance abuse/mental health issues will improve     Medication Management: Evaluate patient's response, side effects, and tolerance of medication regimen.  Therapeutic Interventions: 1 to 1 sessions, Unit Group sessions and Medication administration.  Evaluation of Outcomes: Not Met   RN Treatment Plan for Primary Diagnosis: <principal problem not specified> Long Term Goal(s): Knowledge of disease and therapeutic regimen to maintain health will improve  Short Term Goals: Ability to remain free from injury will improve, Ability to demonstrate self-control, Ability to participate in decision making will improve, Ability to verbalize feelings will improve, Ability to disclose and discuss suicidal ideas, and Ability to identify and develop effective coping behaviors will improve  Medication Management: RN will administer medications as ordered by provider, will assess and evaluate patient's response and provide education to patient for prescribed medication. RN will report any adverse and/or side effects to prescribing provider.  Therapeutic Interventions: 1 on 1 counseling sessions, Psychoeducation, Medication administration, Evaluate responses to treatment, Monitor vital signs and CBGs as ordered, Perform/monitor CIWA, COWS, AIMS and Fall Risk screenings as ordered, Perform wound care treatments as  ordered.  Evaluation of Outcomes: Not Met   LCSW Treatment Plan for Primary Diagnosis: <principal problem not specified> Long Term Goal(s): Safe transition to appropriate next level of care at discharge, Engage patient in therapeutic group addressing interpersonal concerns.  Short Term Goals: Engage patient in aftercare planning with referrals and resources, Increase social support, Increase emotional regulation, Facilitate acceptance of mental health diagnosis and concerns, Identify triggers associated with mental health/substance abuse issues, and Increase skills for wellness and recovery  Therapeutic Interventions: Assess for all discharge needs, 1 to 1 time with Social worker, Explore available resources and support systems, Assess for adequacy in community support network, Educate family and significant other(s) on suicide prevention, Complete Psychosocial Assessment, Interpersonal group therapy.  Evaluation of Outcomes: Not Met   Progress in Treatment: Attending groups: No. Participating in groups: No. Taking medication as prescribed: Yes. Toleration medication: Yes. Family/Significant other contact made: Yes, individual(s) contacted:  Father  Patient understands diagnosis: No. Discussing patient identified problems/goals with staff: Yes. Medical problems stabilized or resolved: Yes. Denies suicidal/homicidal ideation: Yes. Issues/concerns per patient self-inventory: No.   New problem(s) identified: No, Describe:  None   New Short Term/Long Term Goal(s): medication stabilization, elimination of SI thoughts, development of comprehensive mental wellness plan.   Patient Goals: "To get better"   Discharge Plan or Barriers: Patient recently admitted. CSW  will continue to follow and assess for appropriate referrals and possible discharge planning.   Reason for Continuation of Hospitalization: Aggression Delusions  Hallucinations Medication stabilization Suicidal  ideation  Estimated Length of Stay: 3 to 5 days   Attendees: Patient: Alexandra Mcgee 08/20/2020   Physician: Lestine Mount, DO  08/20/2020   Nursing:  08/20/2020   RN Care Manager: 08/20/2020   Social Worker: Verdis Frederickson, [place] 08/20/2020   Recreational Therapist:  08/20/2020   Other:  08/20/2020   Other:  08/20/2020   Other: 08/20/2020     Scribe for Treatment Team: Darleen Crocker, LCSWA 08/20/2020 1:30 PM
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
## 2020-08-20 NOTE — BHH Counselor (Signed)
CSW spoke with Bukola from [facility] who would like to talk with pt about offering to help with her aftercare services. Contact: [phone] or [email or web address].  Fredirick Lathe, LCSWA Clinicial Social Worker [employer]

## 2020-08-21 ENCOUNTER — Encounter (HOSPITAL_COMMUNITY): Payer: Self-pay | Admitting: Family Medicine

## 2020-08-21 MED ORDER — RISPERIDONE 2 MG PO TBDP
2.0000 mg | ORAL_TABLET | Freq: Every day | ORAL | Status: DC
  Administered 2020-08-21: 2 mg via ORAL
  Filled 2020-08-21 (×2): qty 1

## 2020-08-21 MED ORDER — CHLORDIAZEPOXIDE HCL 25 MG PO CAPS
25.0000 mg | ORAL_CAPSULE | Freq: Four times a day (QID) | ORAL | Status: DC | PRN
  Administered 2020-08-21 – 2020-08-24 (×4): 25 mg via ORAL
  Filled 2020-08-21 (×5): qty 1

## 2020-08-21 MED ORDER — HALOPERIDOL LACTATE 5 MG/ML IJ SOLN: 15.0000 mg | Freq: Four times a day (QID) | INTRAMUSCULAR | Status: DC | PRN

## 2020-08-21 MED ORDER — RISPERIDONE 2 MG PO TBDP
3.0000 mg | ORAL_TABLET | Freq: Every day | ORAL | Status: DC
  Administered 2020-08-21: 3 mg via ORAL
  Filled 2020-08-21: qty 1

## 2020-08-21 MED ORDER — TRAZODONE HCL 50 MG PO TABS
50.0000 mg | ORAL_TABLET | Freq: Once | ORAL | Status: AC
  Administered 2020-08-21: 50 mg via ORAL
  Filled 2020-08-21 (×2): qty 1

## 2020-08-21 MED ORDER — PANTOPRAZOLE SODIUM 20 MG PO TBEC
20.0000 mg | DELAYED_RELEASE_TABLET | Freq: Every day | ORAL | Status: DC
  Administered 2020-08-21 – 2020-08-29 (×9): 20 mg via ORAL
  Filled 2020-08-21 (×14): qty 1

## 2020-08-21 MED ORDER — ALBUTEROL SULFATE HFA 108 (90 BASE) MCG/ACT IN AERS
2.0000 | INHALATION_SPRAY | Freq: Two times a day (BID) | RESPIRATORY_TRACT | Status: DC | PRN
  Administered 2020-08-22 – 2020-08-28 (×5): 2 via RESPIRATORY_TRACT

## 2020-08-21 MED ORDER — LORATADINE 10 MG PO TABS
10.0000 mg | ORAL_TABLET | Freq: Every day | ORAL | Status: DC | PRN
  Administered 2020-08-21 – 2020-08-23 (×2): 10 mg via ORAL
  Filled 2020-08-21 (×2): qty 1

## 2020-08-21 MED ORDER — OLANZAPINE 10 MG PO TBDP
10.0000 mg | ORAL_TABLET | Freq: Two times a day (BID) | ORAL | Status: DC
  Filled 2020-08-21 (×4): qty 1

## 2020-08-21 NOTE — BHH Group Notes (Signed)
BHH Group Notes:  (Nursing/MHT/Case Management/Adjunct)  Date:  08/21/2020  Time:  11:26 AM  Type of Therapy:  Group Therapy  Participation Level:  Active  Participation Quality:  Attentive and Intrusive  Affect:  Labile and Not Congruent  Cognitive:  Lacking  Insight:  Lacking  Engagement in Group:  Off Topic  Modes of Intervention:  Orientation  Summary of Progress/Problems: Pt goal for today is to make herself happy and try to stay busy.   Alexandra Mcgee [name] 08/21/2020, 11:26 AM

## 2020-08-21 NOTE — BHH Counselor (Signed)
CSW provided the patient with a packet of information that includes: a listing of shelters and other housing resources, IRC information, free and reduced price food resources, GoodRX cards, and suicide prevention information.  

## 2020-08-21 NOTE — Progress Notes (Signed)
Pt at the nursing station multiple times demanding various things, pt presented with med seeking behaviors earlier this evening. Pt given PRN Trazodone and Vistaril per MAR. Pt came to the nursing station stated she was angry . Pt was requesting medication, pt educated on coping skills and various things that could be done instead of taking medication, pt encouraged to work on her actions to help improve her outcomes.     08/21/20 0000  Psych Admission Type (Psych Patients Only)  Admission Status Involuntary  Psychosocial Assessment  Patient Complaints Anxiety;Suspiciousness  Eye Contact Fair  Facial Expression Anxious  Affect Appropriate to circumstance  Speech Logical/coherent  Interaction Assertive  Motor Activity Fidgety  Appearance/Hygiene Unremarkable  Behavior Characteristics Intrusive;Anxious  Mood Anxious;Labile  Thought Process  Coherency WDL  Content WDL  Delusions None reported or observed  Perception WDL  Hallucination None reported or observed  Judgment Poor  Confusion None  Danger to Self  Current suicidal ideation? Denies  Danger to Others  Danger to Others None reported or observed

## 2020-08-21 NOTE — Progress Notes (Signed)
Recreation Therapy Notes  Date: 7.14.22 Time: 1000 Location: [address] Dayroom  Group Topic: Self-Esteem  Goal Area(s) Addresses:  Patient will successfully identify positive attributes about themselves.  Patient will successfully identify benefit of improved self-esteem.   Behavioral Response: None  Intervention: Blank Face Sheet, Markers  Activity: How I See Me.  Patients were to choose a blank outline of a face that represents them.  Patients were to design an image highlighting the positive things about themselves.  Patients could use words to go along with the art to express their feelings.  Patients and LRT discussed the drawings at during discussion.  Education:  Self-Esteem, Discharge Planning  Education Outcome: Acknowledges education/In group clarification offered/Needs additional education  Clinical Observations/Feedback: Pt came in near the end of group.  LRT gave pt the instructions, pt stated she colors but not draw.  Pt stayed for approximately two minutes then left.  Pt did not return.     Caroll Rancher, LRT/CTRS    Caroll Rancher A 08/21/2020 11:33 AM
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
## 2020-08-21 NOTE — Progress Notes (Signed)
Patient did not attend wrap up group. 

## 2020-08-21 NOTE — Progress Notes (Signed)
Pt presents very needy , attention seeking and appears to have med seeking tendencies. Pt given PRN Vistaril and Trazodone per MAR with HS medication . Pt requested a shot "I'm agitated" , Clinical research associate educated pt again on coping when you get upset, pt appeared to not be receptive, pt appeared to only want medication. Pt was educated on one of the medications she was receiving this evening helps with agitation    08/21/20 2000  Psych Admission Type (Psych Patients Only)  Admission Status Involuntary  Psychosocial Assessment  Patient Complaints Anxiety;Suspiciousness  Eye Contact Fair  Facial Expression Anxious  Affect Appropriate to circumstance  Speech Logical/coherent  Interaction Assertive  Motor Activity Fidgety  Appearance/Hygiene Unremarkable  Behavior Characteristics Anxious;Intrusive  Mood Anxious;Labile;Suspicious;Preoccupied  Thought Process  Coherency WDL  Content WDL  Delusions None reported or observed  Perception WDL  Hallucination None reported or observed  Judgment Poor  Confusion None  Danger to Self  Current suicidal ideation? Denies  Danger to Others  Danger to Others None reported or observed

## 2020-08-21 NOTE — Progress Notes (Signed)
Pt up to the nursing station stating she was doing heroin and drinking . When pt asked how much she was doing daily pt responded "a lot" pt could not quantify a specific amount, pt continued to give vague answers  trying to get medication. Pt given PRN Zyprexa  per [facility] for her agitation and a 1x Trazodone per MAR .

## 2020-08-21 NOTE — Progress Notes (Signed)
Pt is alert and oriented to person, place, time and situation. Pt is attention seeking, intrusive, impulsive. Pt requires frequent redirections. Pt given PRN medications for c/o's of anxiety and agitation. Pt has been restless pacing the hallways at times. Pt denies suicidal and homicidal ideations, denies hallucinations. Will continue to monitor pt per Q15 minute face checks and monitor for safety and progress.

## 2020-08-21 NOTE — Progress Notes (Signed)
Pt up to the nursing station requesting a shot. Pt informed she received 4 mg Risperdal 25 mg Vistaril and 50 mg Trazodone. "Well can I have all three" pt wanted to get them again. Pt was informed that she received 25 mg Librium at 6:60 pm which was still very much in her system. Pt continues to request any drug she can have.

## 2020-08-21 NOTE — Progress Notes (Signed)
Pt up to the nursing station presenting with attention seeking behaviors.

## 2020-08-22 ENCOUNTER — Other Ambulatory Visit (HOSPITAL_COMMUNITY): Payer: Self-pay

## 2020-08-22 MED ORDER — PALIPERIDONE PALMITATE ER 156 MG/ML IM SUSY
156.0000 mg | PREFILLED_SYRINGE | Freq: Once | INTRAMUSCULAR | Status: AC
  Administered 2020-08-26: 156 mg via INTRAMUSCULAR

## 2020-08-22 MED ORDER — INVEGA SUSTENNA 156 MG/ML IM SUSY
PREFILLED_SYRINGE | INTRAMUSCULAR | 0 refills | Status: DC
  Filled 2020-08-22: qty 1, 30d supply, fill #0

## 2020-08-22 MED ORDER — RISPERIDONE 3 MG PO TBDP
3.0000 mg | ORAL_TABLET | Freq: Every day | ORAL | Status: DC
  Administered 2020-08-22 – 2020-08-23 (×2): 3 mg via ORAL
  Filled 2020-08-22 (×4): qty 1

## 2020-08-22 MED ORDER — PALIPERIDONE PALMITATE ER 234 MG/1.5ML IM SUSY: 234.0000 mg | PREFILLED_SYRINGE | Freq: Once | INTRAMUSCULAR | Status: DC

## 2020-08-22 MED ORDER — PALIPERIDONE PALMITATE ER 234 MG/1.5ML IM SUSY
234.0000 mg | PREFILLED_SYRINGE | Freq: Once | INTRAMUSCULAR | Status: AC
  Administered 2020-08-22: 234 mg via INTRAMUSCULAR

## 2020-08-22 MED ORDER — PALIPERIDONE PALMITATE ER 234 MG/1.5ML IM SUSY
PREFILLED_SYRINGE | INTRAMUSCULAR | 0 refills | Status: DC
  Filled 2020-08-22: qty 1.5, 30d supply, fill #0

## 2020-08-22 MED ORDER — RISPERIDONE 2 MG PO TBDP
4.0000 mg | ORAL_TABLET | Freq: Every day | ORAL | Status: DC
  Administered 2020-08-22: 4 mg via ORAL
  Filled 2020-08-22 (×3): qty 2

## 2020-08-22 NOTE — Progress Notes (Signed)
Adult Psychoeducational Group Note  Date:  08/22/2020 Time:  8:42 PM  Group Topic/Focus:  Wrap-Up Group:   The focus of this group is to help patients review their daily goal of treatment and discuss progress on daily workbooks.  Participation Level:  Minimal  Participation Quality:  Appropriate  Affect:  Appropriate  Cognitive:  Oriented  Insight: Limited  Engagement in Group:  Engaged  Modes of Intervention:  Education  Additional Comments:  Patient attend and participated in group tonight. She reports that today she was agitated. She received something to help her, however, it did not last very long  Scot Dock 08/22/2020, 8:42 PM

## 2020-08-22 NOTE — [facility] Group Notes (Signed)
[facility] Group Notes:  (Nursing/MHT/Case Management/Adjunct)  Date:  08/22/2020  Time:  10:41 AM  Type of Therapy:  Group Therapy  Participation Level:  Active  Participation Quality:  Intrusive  Affect:  Labile  Cognitive:  Lacking  Insight:  Lacking  Engagement in Group:  Poor  Modes of Intervention:  Orientation  Summary of Progress/Problems: Pt goal for today is to try have more positive thoughts.   Genoa Freyre J Danaysha Kirn 08/22/2020, 10:41 AM

## 2020-08-22 NOTE — Progress Notes (Signed)
Recreation Therapy Notes  Date: 7.15.22 Time: 1000 Location: 500 Hall Dayroom  Group Topic: Communication, Team Building, Problem Solving  Goal Area(s) Addresses:  Patient will effectively work with peer towards shared goal.  Patient will identify skills used to make activity successful.  Patient will identify how skills used during activity can be applied to reach post d/c goals.   Behavioral Response: None  Intervention: STEM Activity- Glass blower/designer  Activity: Tallest [employer]. In teams of 3-5, patients were given 25 small craft pipe cleaners. Using the materials provided, patients were instructed to compete against the opposing team(s) to build the tallest free-standing structure from floor level. The activity was timed; difficulty increased by Clinical research associate as Production designer, theatre/television/film continued.  Systematically resources were removed with additional directions for example, placing one arm behind their back, working in silence, and shape stipulations. LRT facilitated post-activity discussion reviewing team processes and necessary communication skills involved in completion. Patients were encouraged to reflect how the skills utilized, or not utilized, in this activity can be incorporated to positively impact support systems post discharge.  Education: Pharmacist, community, Scientist, physiological, Discharge Planning   Education Outcome: Acknowledges education/In group clarification offered/Needs additional education.   Clinical Observations/Feedback: Pt did not participate.  Pt listened to instructions and played with the pipe cleaner for awhile before leaving and not returning.    Caroll Rancher, LRT/CTRS         Caroll Rancher A 08/22/2020 11:26 AM
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
## 2020-08-22 NOTE — [facility] Group Notes (Signed)
CSW was unable to complete group due to not having adequate staffing.    Reinhold Rickey MSW, LCSW Clincal Social Worker  [facility]  

## 2020-08-22 NOTE — Progress Notes (Signed)
Pt on the unit trying to be manipulative "someone told me that I can have a shot later" previous nurse confirmed that was a lie. Pt continued to try to get an injection. Pt walking /pacing the unit stating she was about to have a panic attack. Pt was observed trying to work her self up . Pt educated on what she was doing and educated on breathing techniques, pt was informed that if she works herself up to a panic attack she would not get a shot and then she would not be able to take the pills PO. Pt proceeded to calm herself down and did not mention having a panic attack anymore.   Pt given PRN Librium, Trazodone per MAR with HS medication    08/22/20 2000  Psych Admission Type (Psych Patients Only)  Admission Status Involuntary  Psychosocial Assessment  Patient Complaints Anxiety;Hyperactivity  Eye Contact Fair  Facial Expression Anxious  Affect Appropriate to circumstance  Speech Logical/coherent  Interaction Attention-seeking;Demanding;Intrusive;Childlike;Manipulative  Motor Activity Fidgety  Appearance/Hygiene Unremarkable  Behavior Characteristics Anxious;Irritable;Intrusive  Mood Anxious;Suspicious;Preoccupied;Irritable  Thought Process  Coherency WDL  Content WDL  Delusions None reported or observed  Perception Hallucinations  Hallucination Auditory  Judgment Poor  Confusion None  Danger to Self  Current suicidal ideation? Denies  Danger to Others  Danger to Others None reported or observed
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
## 2020-08-22 NOTE — Progress Notes (Signed)
Pt observed to be very restless, fidgety, hyperactive, intrusive, childlike and demanding on interactions. Remains preoccupied with getting injectables "I want my shot, give me my shot now".  Observed walking behind peers, requires multiple verbal redirections to not go into female peer's room and follow him on unit as well. Received PRN Vistaril for anxiety X 2 with minimal effect. Denies SI, HI, VH. and pain when assessed "I only hear my grandma's voice , I'm fine right now, I just need my shot". Reports fair sleep and good appetite "I kept getting up all last night". Emotional support and encouragement offered to pt. Safety checks maintained at Q 15 minutes checks intervals without self harm gestures. All medications including monthly Invega injection given with verbal education and effects monitored.  Pt tolerated all meals, fluids and medications well without discomfort. Pt remains on unit restriction due to her intrusive behavior.

## 2020-08-22 NOTE — [facility] Group Notes (Signed)
SPIRITUALITY GROUP NOTE   Spirituality group facilitated by Kathleen Argue, BCC.   Group Description: Group focused on topic of hope. Patients participated in facilitated discussion around topic, connecting with one another around experiences and definitions for hope. Group members engaged with visual explorer photos, reflecting on what hope looks like for them today. Group engaged in discussion around how their definitions of hope are present today in hospital.   Modalities: Psycho-social ed, Adlerian, Narrative, MI   Patient Progress: Alexandra Mcgee participated in group and was eager to engage in conversation.  Her verbal comments were on topic and she was also an attentive listener to others in the group.    Chaplain Dyanne Carrel, Bcc Pager, [phone] 3:18 PM

## 2020-08-22 NOTE — Progress Notes (Signed)
Pt up to the nursing station stating "I keep waking up" pt appears to keep waking up because she wants more medication. Pt standing at the nursing station falling asleep, pt trying to get more medication. Pt informed if she relaxes then she will fall asleep and stay asleep longer.

## 2020-08-23 MED ORDER — BENZTROPINE MESYLATE 0.5 MG PO TABS: 0.5000 mg | ORAL_TABLET | Freq: Two times a day (BID) | ORAL | Status: DC | PRN

## 2020-08-23 MED ORDER — RISPERIDONE 3 MG PO TBDP
3.0000 mg | ORAL_TABLET | Freq: Two times a day (BID) | ORAL | Status: DC
  Administered 2020-08-23 – 2020-08-24 (×2): 3 mg via ORAL
  Filled 2020-08-23 (×2): qty 1

## 2020-08-23 MED ORDER — BENZTROPINE MESYLATE 1 MG PO TABS
1.0000 mg | ORAL_TABLET | ORAL | Status: AC
  Administered 2020-08-23: 1 mg via ORAL
  Filled 2020-08-23 (×2): qty 1

## 2020-08-23 NOTE — [facility] Group Notes (Signed)
  [facility] LCSW Group Therapy Note  Date/Time:  08/23/2020 11:15AM-12:00PM  Type of Therapy and Topic:  Group Therapy:  Feelings About Hospitalization  Participation Level:  None   Description of Group This process group involved patients discussing their feelings related to being hospitalized, as well as the benefits they see to being in the hospital.  These feelings and benefits were itemized.  The group then brainstormed specific ways in which they could seek those same benefits when they discharge and return home.  Therapeutic Goals Patient will identify and describe positive and negative feelings related to hospitalization Patient will verbalize benefits of hospitalization to themselves personally Patients will brainstorm together ways they can obtain similar benefits in the outpatient setting, identify barriers to wellness and possible solutions  Summary of Patient Progress:  The patient expressed her primary feelings about being hospitalized are "I don't know."  She was in and out of the room, pacing, throughout the entirety of group.  Therapeutic Modalities Cognitive Behavioral Therapy Motivational Interviewing    Ambrose Mantle, LCSW 08/23/2020, 9:42 AM  ;
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
## 2020-08-23 NOTE — Progress Notes (Signed)
Adult Psychoeducational Group Note  Date:  08/23/2020 Time:  11:12 PM  Group Topic/Focus:  Wrap-Up Group:   The focus of this group is to help patients review their daily goal of treatment and discuss progress on daily workbooks.  Participation Level:  Active  Participation Quality:  Appropriate  Affect:  Appropriate  Cognitive:  Appropriate  Insight: Appropriate  Engagement in Group:  Developing/Improving  Modes of Intervention:  Discussion  Additional Comments: Pt stated her goal for today was to focus on her treatment plan. Pt stated she accomplished her goal today. Pt stated she talked with her doctor and her social worker about her care today. Pt rated her overall day a 10. Pt stated she was able to contact her mother today which improved her overall day. Pt stated she felt better about herself tonight. Pt stated she was able to attend all groups held today. Pt stated she was brought back all meals today by staff because she is still on unit restriction. Pt stated she took all medications provided today. Pt stated her appetite was pretty good today. Pt rated her sleep last night was pretty good. Pt stated the goal tonight was to get some rest. Pt stated she had no physical pain today. Pt deny visual hallucinations and auditory issues tonight.  Pt denies thoughts of harming herself or others. Pt stated she would alert staff if anything changed.  Felipa Furnace 08/23/2020, 11:12 PM

## 2020-08-23 NOTE — Progress Notes (Signed)
Pt was re-directed a few times last night from going into peers room.

## 2020-08-24 DIAGNOSIS — F3110 Bipolar disorder, current episode manic without psychotic features, unspecified: Secondary | ICD-10-CM | POA: Diagnosis not present

## 2020-08-24 MED ORDER — LITHIUM CARBONATE ER 450 MG PO TBCR
450.0000 mg | EXTENDED_RELEASE_TABLET | Freq: Every day | ORAL | Status: DC
  Administered 2020-08-24 – 2020-08-25 (×2): 450 mg via ORAL
  Filled 2020-08-24 (×5): qty 1

## 2020-08-24 MED ORDER — HALOPERIDOL LACTATE 5 MG/ML IJ SOLN
10.0000 mg | Freq: Four times a day (QID) | INTRAMUSCULAR | Status: DC | PRN
  Filled 2020-08-24: qty 2

## 2020-08-24 MED ORDER — RISPERIDONE 2 MG PO TBDP
4.0000 mg | ORAL_TABLET | Freq: Every day | ORAL | Status: DC
  Administered 2020-08-24 – 2020-08-27 (×4): 4 mg via ORAL
  Filled 2020-08-24 (×8): qty 2

## 2020-08-24 MED ORDER — BENZTROPINE MESYLATE 1 MG PO TABS
1.0000 mg | ORAL_TABLET | Freq: Two times a day (BID) | ORAL | Status: DC | PRN
  Administered 2020-08-25 – 2020-08-27 (×2): 1 mg via ORAL
  Filled 2020-08-24 (×2): qty 1

## 2020-08-24 MED ORDER — RISPERIDONE 2 MG PO TBDP
2.0000 mg | ORAL_TABLET | Freq: Every day | ORAL | Status: DC
  Administered 2020-08-25: 2 mg via ORAL
  Filled 2020-08-24 (×3): qty 1

## 2020-08-24 MED ORDER — LITHIUM CARBONATE ER 300 MG PO TBCR
300.0000 mg | EXTENDED_RELEASE_TABLET | ORAL | Status: AC
  Administered 2020-08-24: 300 mg via ORAL
  Filled 2020-08-24 (×2): qty 1

## 2020-08-24 NOTE — Progress Notes (Addendum)
Adult Psychoeducational Group Note  Date:  08/24/2020 Time:  8:53 PM  Group Topic/Focus:  Wrap-Up Group:   The focus of this group is to help patients review their daily goal of treatment and discuss progress on daily workbooks.  Participation Level:  Active  Participation Quality:  Appropriate  Affect:  Appropriate  Cognitive:  Appropriate  Insight: Appropriate  Engagement in Group:  Developing/Improving  Modes of Intervention:  Discussion  Additional Comments:  Pt stated her goal for today was to focus on her treatment plan. Pt stated she accomplished her goal today. Pt stated she talked with her doctor and her social worker about her care today. Pt rated her overall day a 10. Pt stated she was able to contact her father today which improved her overall day. Pt stated she felt better about herself tonight. Pt stated she was able to attend all groups held today. Pt stated she was brought back all meals today by staff because she is still on unit restriction. Pt stated she took all medications provided today. Pt stated her appetite was pretty good today. Pt rated her sleep last night was pretty good. Pt stated the goal tonight was to get some rest. Pt stated she had no physical pain today. Pt admitted to experiencing some visual hallucinations and auditory issues tonight. Pt nurse was update on the situation.  Pt denies thoughts of harming herself or others. Pt stated she would alert staff if anything changed.  Alexandra Mcgee 08/24/2020, 8:53 PM

## 2020-08-24 NOTE — Progress Notes (Signed)
   08/23/20 2100  Psych Admission Type (Psych Patients Only)  Admission Status Involuntary  Psychosocial Assessment  Eye Contact Fair  Facial Expression Anxious  Affect Appropriate to circumstance  Speech Logical/coherent  Interaction Attention-seeking;Intrusive;Childlike;Manipulative  Motor Activity Fidgety  Appearance/Hygiene Unremarkable  Behavior Characteristics Intrusive;Restless  Mood Anxious;Preoccupied  Thought Process  Coherency WDL  Content WDL  Delusions None reported or observed  Perception Hallucinations  Hallucination Auditory  Judgment Poor  Confusion None  Danger to Self  Current suicidal ideation? Denies  Danger to Others  Danger to Others None reported or observed

## 2020-08-24 NOTE — Progress Notes (Signed)
Progress note  Pt found in the hallway pacing; compliant with medication administration. Pt continues to be child like and intrusive. Pt has poor boundaries and is either attention seeking with constant questioning about the same topics, or is very poor historian with their own behavior. Pt's wardrobe has improved and isn't as revealing. Pt does asking for medications for anxiety often, but could be drug seeking. Pt denies si/hi/ah/vh and verbally agrees to approach staff if these become apparent or before harming themselves/others while at [facility].  A: Pt provided support and encouragement. Pt given medication per protocol and standing orders. Q61m safety checks implemented and continued.  R: Pt safe on the unit. Will continue to monitor.
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
## 2020-08-24 NOTE — [facility] Group Notes (Signed)
Pt was sleeping and didn't attend group. 

## 2020-08-24 NOTE — [facility] Group Notes (Signed)
[facility] LCSW Group Therapy Note  Date/Time:  08/24/2020  11:00AM-12:00PM  Type of Therapy and Topic:  Group Therapy:  Music and Mood  Participation Level:  Active   Description of Group: In this process group, members listened to a variety of genres of music and identified that different types of music evoke different responses.  Patients were encouraged to identify music that was soothing for them and music that was energizing for them.  Patients discussed how this knowledge can help with wellness and recovery in various ways including managing depression and anxiety as well as encouraging healthy sleep habits.    Therapeutic Goals: Patients will explore the impact of different varieties of music on mood Patients will verbalize the thoughts they have when listening to different types of music Patients will identify music that is soothing to them as well as music that is energizing to them Patients will discuss how to use this knowledge to assist in maintaining wellness and recovery Patients will explore the use of music as a coping skill  Summary of Patient Progress:  At the beginning of group, patient expressed that she felt "great."  She participated fully in group, singing and dancing at different times.    At the end of group, patient expressed that she had felt "on edge" when group started and now felt "better."  In the hallway, she told other staff, "I feel so much better now."    Therapeutic Modalities: Solution Focused Brief Therapy Activity   Ambrose Mantle, LCSW

## 2020-08-24 NOTE — Plan of Care (Signed)
  Problem: Physical Regulation: Goal: Ability to maintain clinical measurements within normal limits will improve Outcome: Progressing   Problem: Safety: Goal: Periods of time without injury will increase Outcome: Progressing   Problem: Education: Goal: Ability to make informed decisions regarding treatment will improve Outcome: Progressing   

## 2020-08-25 ENCOUNTER — Other Ambulatory Visit (HOSPITAL_COMMUNITY): Payer: Self-pay

## 2020-08-25 MED ORDER — FLUCONAZOLE 100 MG PO TABS
150.0000 mg | ORAL_TABLET | Freq: Once | ORAL | Status: AC
  Administered 2020-08-26: 150 mg via ORAL
  Filled 2020-08-25: qty 1.5
  Filled 2020-08-25: qty 2

## 2020-08-25 MED ORDER — FLUCONAZOLE 100 MG PO TABS
150.0000 mg | ORAL_TABLET | Freq: Once | ORAL | Status: DC
  Filled 2020-08-25: qty 1.5

## 2020-08-25 MED ORDER — RISPERIDONE 1 MG PO TBDP
1.0000 mg | ORAL_TABLET | Freq: Every day | ORAL | Status: DC
  Administered 2020-08-26: 1 mg via ORAL
  Filled 2020-08-25 (×4): qty 1

## 2020-08-25 NOTE — Plan of Care (Signed)
  Problem: Education: Goal: Verbalization of understanding the information provided will improve Outcome: Progressing   Problem: Activity: Goal: Interest or engagement in activities will improve Outcome: Progressing   

## 2020-08-25 NOTE — Progress Notes (Signed)
Recreation Therapy Notes  Date: 7.18.22 Time: 1010 Location: [address] Hall Dayroom  Group Topic: Self-Esteem  Goal Area(s) Addresses:  Patient will successfully identify positive attributes about themselves.  Patient will successfully identify benefit of improved self-esteem.   Behavioral Response: Engaged  Intervention: Markers, Colored Pencils, Glue Sticks, [employer], Wellsite geologist, Music, Magazines  Activity: Collage About Me.  Patients were to create a collage about who they are using the materials provided.  Patients were to highlight places they want to go, things they are proud of, anything that's important to them that has helped to form the person they have become.  Education:  Self-Esteem, Discharge Planning  Education Outcome: Acknowledges education/In group clarification offered/Needs additional education  Clinical Observations/Feedback: Pt was attentive and social with peers.  Pt was also attentive to the music and also sang along with some of the songs.  Pt able to make it through the whole group.  Pt had a dog on her collage representing wanting to get a dog when discharged.  Pt expressed finding joy since being here and likes sharing accomplishments with others.  Pt was appropriate during group.     Caroll Rancher, LRT/CTRS        Caroll Rancher A 08/25/2020 1:03 PM

## 2020-08-25 NOTE — Progress Notes (Signed)
Alexandra Mcgee was up and pacing on the unit.  She attended evening wrap up group.  She remains childlike and intrusive requiring frequent staff redirection.  She denied SI/HI or AVH however she appears to be be responding to internal stimuli.  She was hyper focused about her medication and received prn trazodone for sleep.  She is currently resting with her eyes closed and appears to be asleep.  Q 15 minute checks maintained for safety.  We will continue to monitor the progress towards her goals.    08/25/20 0130  Psych Admission Type (Psych Patients Only)  Admission Status Involuntary  Psychosocial Assessment  Patient Complaints Anxiety;Insomnia  Eye Contact Fair  Facial Expression Anxious;Blank  Affect Anxious  Speech Logical/coherent  Interaction Assertive;Intrusive  Motor Activity Restless;Pacing  Appearance/Hygiene Unremarkable  Behavior Characteristics Cooperative  Mood Anxious;Preoccupied  Thought Chartered certified accountant of ideas  Content Compulsions  Delusions None reported or observed  Perception Hallucinations  Hallucination Auditory  Judgment Poor  Confusion None  Danger to Self  Current suicidal ideation? Denies  Danger to Others  Danger to Others None reported or observed

## 2020-08-25 NOTE — Progress Notes (Signed)
Notified by nursing staff that patient has multiple GU complaints, as well as one dermatologic complaint.  Patient seen and examined by myself with RN present during my assessment.  Patient reports dysuria and that she is experiencing "UTI symptoms".  Patient also reports that her urine is dark yellow in appearance.  Patient also states that her urine smells like "bacon".  Patient denies hematuria.  Patient also endorses vaginal discharge that patient describes as "white and clumpy, like cottage cheese".  Patient endorses vaginal itching, but denies vaginal/genital pain.  Patient also reports that her period is now 1 week late. Per chart review, patient had I-stat beta hCG blood, quantitative checked on 08/15/20, which was elevated at 11.2 mIU/mL (ref. Range <5 mIU/mL).  This elevated I-stat hCG was followed up with hcG, quantitative, pregnancy (Beta Chain, Quant S) on 08/15/20, which was within normal limits at <1 mIU/mL (ref. Range <5 mIU/mL).  Patient denies any additional GU symptoms.  Patient is also reporting that she fell on her right knee 1 to 2 weeks ago which left a wound and she states that she is concerned that this wound may be becoming infected. Patient states that this wound drained a "white greenish" colored drainage a few days ago, but she denies any drainage since. Patient denies any fever, chills, chest pain, shortness of breath, headache, lightheadedness, dizziness, abdominal pain, nausea, vomiting, or any additional physical symptoms at this time. Examination of patient's right leg/knee shows medium size, black, crusting, scab-type lesion, with slight surrounding ring of erythema/pink-colored skin with no warmth to palpation noted, and no purulent drainage or any additional signs of infection noted.   UA with reflex microscopic ordered to check for potential UTI.  Will also order GC/Chlamydia urine probe amp to evaluate for potential gonorrhea or chlamydia.  Repeat hCG, quantitative, pregnancy  ordered to be drawn on the morning of 08/26/2020 with patient's other 08/26/20 AM labs (BMP, lithium level, TSH) to re-evaluate for potential pregnancy.  One-time dose of Diflucan 150 mg p.o. ordered for treatment of potential vulvovaginal candidiasis.  Based on my examination of patient's right knee lesion, patient's right knee lesion appears to be more scab-like in nature and does not appear to be infected at this time.  Modified patient's current order of BID Neosporin that patient was applying between her right great toe and second toe BID to also include applying Neosporin twice daily to her right knee area abrasion/scab lesion mentioned above.  Nursing to continue to monitor patient's right knee area lesion for potential signs of developing infection.   Treatment plan above discussed with the patient and patient verbalizes understanding and agreement with the overall treatment plan.  All patient's questions answered and concerns addressed.  Patient to notify nursing staff if additional questions or concerns arise.  Nursing staff to continue to monitor patient for safety.

## 2020-08-25 NOTE — Tx Team (Signed)
Interdisciplinary Treatment and Diagnostic Plan Update  08/25/2020 Time of Session: 9:00am  Alexandra Mcgee MRN: [id number]  Principal Diagnosis: Bipolar I disorder with mania (HCC)  Secondary Diagnoses: Principal Problem:   Bipolar I disorder with mania (HCC) Active Problems:   Alcohol abuse with unspecified alcohol-induced disorder (HCC)   Polysubstance abuse (HCC)   Suicidal ideation   Substance induced mood disorder (HCC)   Current Medications:  Current Facility-Administered Medications  Medication Dose Route Frequency Provider Last Rate Last Admin   acetaminophen (TYLENOL) tablet 650 mg  650 mg Oral Q6H PRN Novella Olive, NP   650 mg at 08/20/20 0028   albuterol (VENTOLIN HFA) 108 (90 Base) MCG/ACT inhaler 2 puff  2 puff Inhalation Q12H PRN Princess Bruins, DO   2 puff at 08/22/20 1801   alum & mag hydroxide-simeth (MAALOX/MYLANTA) 200-200-20 MG/5ML suspension 30 mL  30 mL Oral Q4H PRN Novella Olive, NP       benztropine (COGENTIN) tablet 1 mg  1 mg Oral BID PRN Antonieta Pert, MD   1 mg at 08/25/20 1350   chlordiazePOXIDE (LIBRIUM) capsule 25 mg  25 mg Oral Q6H PRN Antonieta Pert, MD   25 mg at 08/24/20 1234   clotrimazole (LOTRIMIN) 1 % cream   Topical BID Antonieta Pert, MD   1 application at 08/25/20 0805   folic acid (FOLVITE) tablet 1 mg  1 mg Oral Daily Princess Bruins, DO   1 mg at 08/25/20 0805   gabapentin (NEURONTIN) capsule 300 mg  300 mg Oral TID Novella Olive, NP   300 mg at 08/25/20 1237   haloperidol lactate (HALDOL) injection 10 mg  10 mg Intramuscular Q6H PRN Antonieta Pert, MD       hydrOXYzine (ATARAX/VISTARIL) tablet 25 mg  25 mg Oral TID PRN Novella Olive, NP   25 mg at 08/24/20 1057   lithium carbonate (ESKALITH) CR tablet 450 mg  450 mg Oral QHS Antonieta Pert, MD   450 mg at 08/24/20 2047   loratadine (CLARITIN) tablet 10 mg  10 mg Oral Daily PRN Princess Bruins, DO   10 mg at 08/23/20 1204   magnesium hydroxide (MILK OF MAGNESIA)  suspension 30 mL  30 mL Oral Daily PRN Novella Olive, NP       mupirocin cream (BACTROBAN) 2 %   Topical BID Antonieta Pert, MD   1 application at 08/25/20 7824   neomycin-bacitracin-polymyxin (NEOSPORIN) ointment   Topical BID Antonieta Pert, MD   1 application at 08/25/20 0806   nicotine (NICODERM CQ - dosed in mg/24 hours) patch 21 mg  21 mg Transdermal Q0600 Novella Olive, NP   21 mg at 08/25/20 0619   OLANZapine zydis (ZYPREXA) disintegrating tablet 10 mg  10 mg Oral Q8H PRN Antonieta Pert, MD   10 mg at 08/24/20 1057   [START ON 08/26/2020] paliperidone (INVEGA SUSTENNA) injection 156 mg  156 mg Intramuscular Once Antonieta Pert, MD       pantoprazole (PROTONIX) EC tablet 20 mg  20 mg Oral Daily Princess Bruins, DO   20 mg at 08/25/20 0805   [START ON 08/26/2020] risperiDONE (RISPERDAL M-TABS) disintegrating tablet 1 mg  1 mg Oral Daily Antonieta Pert, MD       risperiDONE (RISPERDAL M-TABS) disintegrating tablet 4 mg  4 mg Oral QHS Antonieta Pert, MD   4 mg at 08/24/20 2047   traZODone (DESYREL) tablet 50 mg  50 mg Oral QHS PRN Novella Olive, NP   50 mg at 08/24/20 2047   PTA Medications: Medications Prior to Admission  Medication Sig Dispense Refill Last Dose   albuterol (VENTOLIN HFA) 108 (90 Base) MCG/ACT inhaler Inhale 1 puff into the lungs every 6 (six) hours as needed for wheezing or shortness of breath.      cariprazine (VRAYLAR) 3 MG capsule Take 3 mg by mouth daily. LF 2020 per [facility] (Patient not taking: Reported on 08/18/2020)   Not Taking   clonazePAM (KLONOPIN) 1 MG tablet Take 1.5 mg by mouth 2 (two) times daily. LF 2020 per [facility] in [place]      ibuprofen (ADVIL) 200 MG tablet Take 400 mg by mouth every 6 (six) hours as needed for mild pain or headache.      Melatonin 10 MG TABS Take 10 mg by mouth at bedtime as needed (sleep). (Patient not taking: Reported on 08/18/2020)   Not Taking   metFORMIN (GLUCOPHAGE-XR) 500 MG 24  hr tablet Take 500 mg by mouth 2 (two) times daily. (Patient not taking: Reported on 08/18/2020)   Not Taking    Patient Stressors: Financial difficulties Health problems Legal issue Substance abuse  Patient Strengths: Barrister's clerk for treatment/growth  Treatment Modalities: Medication Management, Group therapy, Case management,  1 to 1 session with clinician, Psychoeducation, Recreational therapy.   Physician Treatment Plan for Primary Diagnosis: Bipolar I disorder with mania (HCC) Long Term Goal(s): Improvement in symptoms so as ready for discharge   Short Term Goals: Ability to identify changes in lifestyle to reduce recurrence of condition will improve Ability to verbalize feelings will improve Ability to disclose and discuss suicidal ideas Ability to demonstrate self-control will improve Ability to identify and develop effective coping behaviors will improve Ability to maintain clinical measurements within normal limits will improve Compliance with prescribed medications will improve Ability to identify triggers associated with substance abuse/mental health issues will improve  Medication Management: Evaluate patient's response, side effects, and tolerance of medication regimen.  Therapeutic Interventions: 1 to 1 sessions, Unit Group sessions and Medication administration.  Evaluation of Outcomes: Progressing  Physician Treatment Plan for Secondary Diagnosis: Principal Problem:   Bipolar I disorder with mania (HCC) Active Problems:   Alcohol abuse with unspecified alcohol-induced disorder (HCC)   Polysubstance abuse (HCC)   Suicidal ideation   Substance induced mood disorder (HCC)  Long Term Goal(s): Improvement in symptoms so as ready for discharge   Short Term Goals: Ability to identify changes in lifestyle to reduce recurrence of condition will improve Ability to verbalize feelings will improve Ability to disclose and discuss suicidal  ideas Ability to demonstrate self-control will improve Ability to identify and develop effective coping behaviors will improve Ability to maintain clinical measurements within normal limits will improve Compliance with prescribed medications will improve Ability to identify triggers associated with substance abuse/mental health issues will improve     Medication Management: Evaluate patient's response, side effects, and tolerance of medication regimen.  Therapeutic Interventions: 1 to 1 sessions, Unit Group sessions and Medication administration.  Evaluation of Outcomes: Progressing   RN Treatment Plan for Primary Diagnosis: Bipolar I disorder with mania (HCC) Long Term Goal(s): Knowledge of disease and therapeutic regimen to maintain health will improve  Short Term Goals: Ability to remain free from injury will improve, Ability to demonstrate self-control, Ability to participate in decision making will improve, Ability to verbalize feelings will improve, Ability to disclose and discuss suicidal  ideas, and Ability to identify and develop effective coping behaviors will improve  Medication Management: RN will administer medications as ordered by provider, will assess and evaluate patient's response and provide education to patient for prescribed medication. RN will report any adverse and/or side effects to prescribing provider.  Therapeutic Interventions: 1 on 1 counseling sessions, Psychoeducation, Medication administration, Evaluate responses to treatment, Monitor vital signs and CBGs as ordered, Perform/monitor CIWA, COWS, AIMS and Fall Risk screenings as ordered, Perform wound care treatments as ordered.  Evaluation of Outcomes: Progressing   LCSW Treatment Plan for Primary Diagnosis: Bipolar I disorder with mania (HCC) Long Term Goal(s): Safe transition to appropriate next level of care at discharge, Engage patient in therapeutic group addressing interpersonal concerns.  Short Term  Goals: Engage patient in aftercare planning with referrals and resources, Increase social support, Increase emotional regulation, Facilitate acceptance of mental health diagnosis and concerns, Identify triggers associated with mental health/substance abuse issues, and Increase skills for wellness and recovery  Therapeutic Interventions: Assess for all discharge needs, 1 to 1 time with Social worker, Explore available resources and support systems, Assess for adequacy in community support network, Educate family and significant other(s) on suicide prevention, Complete Psychosocial Assessment, Interpersonal group therapy.  Evaluation of Outcomes: Progressing   Progress in Treatment: Attending groups: Yes. Participating in groups: Yes. and No. Taking medication as prescribed: Yes. Toleration medication: Yes. Family/Significant other contact made: Yes, individual(s) contacted:  Father  Patient understands diagnosis: No. Discussing patient identified problems/goals with staff: Yes. Medical problems stabilized or resolved: Yes. Denies suicidal/homicidal ideation: Yes. Issues/concerns per patient self-inventory: No.   New problem(s) identified: No, Describe:  None   New Short Term/Long Term Goal(s): medication stabilization, elimination of SI thoughts, development of comprehensive mental wellness plan.   Patient Goals: "To get better"   Discharge Plan or Barriers: Patient is to follow up with [facility] for therapy and medication management at discharge. Pt is currently homeless.  Reason for Continuation of Hospitalization: Delusions  Medication stabilization  Estimated Length of Stay: 3 to 5 days   Attendees: Patient:  08/25/2020   Physician:  08/25/2020   Nursing:  08/25/2020   RN Care Manager: 08/25/2020   Social Worker: Ruthann Cancer, LCSW 08/25/2020   Recreational Therapist:  08/25/2020   Other:  08/25/2020   Other:  08/25/2020   Other: 08/25/2020     Scribe for Treatment  Team: Otelia Santee, LCSW 08/25/2020 2:28 PM
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
## 2020-08-25 NOTE — Progress Notes (Signed)
[facility] Medical Student Progress Note    Patient Name: Alexandra Mcgee  MRN: [id number]  DOB: 05/28/2000   Subjective:   Ms. Crafton is a 20 yo F with diagnosis of bipolar disorder with mania and polysubstance use d/o admitted to the [facility] on 08/18/20 for evaluation and stabilization of SI and psychotic features of auditory and visual hallucinations. She is currently on Hospital Day 7.  Today pt was overly sedated, flat affect, and speech was slowed. She was pleasant and cooperative with interview. Pt engaged minimally to conversation due to sedation. Pt states she is in a "good" mood. She states she has been compliant with her medications. She received her 1st dose of lithium 450mg  yesterday night. She is now taking Risperdal 4 mg p.o daily at bedtime and 2 mg in the morning. She no longer endorses AVH, mood swings, agitation, paranoia, or somatic sx. Her rash has improved since taking topical ointments. She denies SI or HI. EKG on 08/22/20 revealed sinus tachycardia with a HR of 101 and normal QTc interval. Her bp today was 121/91 with a hr of 112. She does not endorse chest discomfort, palpitations, or SOB. She slept 6.25 hours last night. She is sedative on interview. She states her appetite is good.   Past Psychiatric History: see admission H&P  Past Medical History: see admission H&P  Surgical History: No past surgical history on file.   Family History: see admission H&P  Allergies: Allergies  Allergen Reactions   Aripiprazole     Other reaction(s): Other (See Comments), Other (See Comments) seizures Seizure, could not speak and back spasm (per patient report on 09/26/17)    Fluoxetine     Other reaction(s): Other (See Comments) Seizures, muscle spasms   Lac Bovis     Other reaction(s): Other (See Comments), Other (See Comments) Upset stomach and diarrhea Upset stomach and diarrhea    Lurasidone     Other reaction(s): Other (See Comments) Seizures, memory loss, muscle spasm    Ziprasidone Hcl     Other reaction(s): Other (See Comments) Reports seizures Reports seizures     Current Medications:  Current Facility-Administered Medications:    acetaminophen (TYLENOL) tablet 650 mg, 650 mg, Oral, Q6H PRN, 09/28/17, NP, 650 mg at 08/20/20 0028   albuterol (VENTOLIN HFA) 108 (90 Base) MCG/ACT inhaler 2 puff, 2 puff, Inhalation, Q12H PRN, 08/22/20, DO, 2 puff at 08/22/20 1801   alum & mag hydroxide-simeth (MAALOX/MYLANTA) 200-200-20 MG/5ML suspension 30 mL, 30 mL, Oral, Q4H PRN, 08-17-2000, NP   benztropine (COGENTIN) tablet 1 mg, 1 mg, Oral, BID PRN, Novella Olive, MD   chlordiazePOXIDE (LIBRIUM) capsule 25 mg, 25 mg, Oral, Q6H PRN, Antonieta Pert, MD, 25 mg at 08/24/20 1234   clotrimazole (LOTRIMIN) 1 % cream, , Topical, BID, 08/26/20, Jola Babinski, MD, 1 application at 08/25/20 0805   folic acid (FOLVITE) tablet 1 mg, 1 mg, Oral, Daily, 08/27/20, DO, 1 mg at 08/25/20 0805   gabapentin (NEURONTIN) capsule 300 mg, 300 mg, Oral, TID, 08/27/20, NP, 300 mg at 08/25/20 0805   haloperidol lactate (HALDOL) injection 10 mg, 10 mg, Intramuscular, Q6H PRN, 08/27/20, MD   hydrOXYzine (ATARAX/VISTARIL) tablet 25 mg, 25 mg, Oral, TID PRN, Antonieta Pert, NP, 25 mg at 08/24/20 1057   lithium carbonate (ESKALITH) CR tablet 450 mg, 450 mg, Oral, QHS, 08/26/20, MD, 450 mg at 08/24/20 2047   loratadine (CLARITIN) tablet 10 mg, 10 mg,  Oral, Daily PRN, Princess Bruins, DO, 10 mg at 08/23/20 1204   magnesium hydroxide (MILK OF MAGNESIA) suspension 30 mL, 30 mL, Oral, Daily PRN, Novella Olive, NP   mupirocin cream (BACTROBAN) 2 %, , Topical, BID, Jola Babinski, Marlane Mingle, MD, 1 application at 08/25/20 8527   neomycin-bacitracin-polymyxin (NEOSPORIN) ointment, , Topical, BID, Antonieta Pert, MD, 1 application at 08/25/20 7824   nicotine (NICODERM CQ - dosed in mg/24 hours) patch 21 mg, 21 mg, Transdermal, Q0600, Novella Olive, NP, 21 mg at  08/25/20 0619   OLANZapine zydis (ZYPREXA) disintegrating tablet 10 mg, 10 mg, Oral, Q8H PRN, 10 mg at 08/24/20 1057 **AND** [DISCONTINUED] LORazepam (ATIVAN) tablet 1 mg, 1 mg, Oral, Q6H PRN, Antonieta Pert, MD, 1 mg at 08/21/20 1006   [START ON 08/26/2020] paliperidone (INVEGA SUSTENNA) injection 156 mg, 156 mg, Intramuscular, Once, Clary, Marlane Mingle, MD   pantoprazole (PROTONIX) EC tablet 20 mg, 20 mg, Oral, Daily, Princess Bruins, DO, 20 mg at 08/25/20 0805   risperiDONE (RISPERDAL M-TABS) disintegrating tablet 2 mg, 2 mg, Oral, Daily, Antonieta Pert, MD, 2 mg at 08/25/20 0805   risperiDONE (RISPERDAL M-TABS) disintegrating tablet 4 mg, 4 mg, Oral, QHS, Antonieta Pert, MD, 4 mg at 08/24/20 2047   traZODone (DESYREL) tablet 50 mg, 50 mg, Oral, QHS PRN, Novella Olive, NP, 50 mg at 08/24/20 2047  Social History: Social History   Socioeconomic History   Marital status: Single    Spouse name: Not on file   Number of children: Not on file   Years of education: Not on file   Highest education level: 10th grade (per adoptive father)  Occupational History   Not on file  Tobacco Use   Smoking status: Every Day    Types: Cigarettes   Smokeless tobacco: Not on file  Vaping Use   Vaping Use: Every day   Substances: THC  Substance and Sexual Activity   Alcohol use: Not on file   Drug use: Heroin 2 bags daily for 4 yrs Crack 1 "dub" daily for for 4 yrs Methamphetamine 3-4 puffs daily, 4 yrs Percocet 1 "big pill" daily, 1 yr     Sexual activity: Not on file  Other Topics Concern   Not on file  Social History Narrative   Not on file   Social Determinants of Health   Financial Resource Strain: Not on file  Food Insecurity: Not on file  Transportation Needs: Not on file  Physical Activity: Not on file  Stress: Not on file  Social Connections: Not on file    Review of Systems: CON: No weight changes, fever, chills, diaphoresis CAR: No palpitations, chest discomfort,  RES:  No shortness of breath, wheezing, cough, or hemoptysis GI: No nausea, vomiting, diarrhea, constipation, abdominal tenderness GU: No urinary discomfort, incontinence, or retention MSK: No myalgias, arthralgias, or back pain NEU: No changes in orientation, memory, dizziness, seizures, or tremors PSY: No anxiety, depression, anhedonia, mood changes, or hallucinations SKIN: Rash on R foot   Objective: Psychiatric Specialty Exam: Blood pressure (!) 121/91, pulse (!) 112, temperature 98.7 F (37.1 C), temperature source Oral, resp. rate 18, height 5' (1.524 m), weight 53.5 kg, SpO2 100 %.Body mass index is 23.05 kg/m.  General Appearance: Well-groomed  Eye Contact:  Fair  Speech:  Slow  Volume:  Decreased  Mood:  Euthymic  Affect:  Flat  Thought Process:  Goal Directed and Descriptions of Associations: Intact  Orientation:  NA  Thought Content:  WDL  Suicidal Thoughts:  No  Homicidal Thoughts:  No  Memory:  Immediate;   Fair Recent;   Fair  Judgement:  Fair  Insight:  Lacking  Psychomotor Activity:  Decreased  Concentration:  Concentration: Fair and Attention Span: Fair  Recall:  FiservFair  Fund of Knowledge:  Fair  Language:  Fair  Akathisia:  No  AIMS (if indicated):    See below  Assets:  Desire for Improvement  ADL's:  Intact  Cognition:  WNL  Sleep:  Number of Hours: 6.25   Physical Exam: GEN: Well developed, in NAD HEENT: NCAT, neck supple, PERRL, TM clear bilaterally, pink nasal mucosa, MMM without erythema, lesions, or exudates RESP: Breathing comfortably on RA, no retractions, wheezes, rhonchi, or crackles EXT: Moves all extremities equally  AIMS:  -Facial and Oral Movements Muscles of Facial Expression: None, normal Lips and Perioral Area: None, normal Jaw: None, normal Tongue: None, normal -Extremity Movements Upper (arms, wrists, hands, fingers): None, normal Lower (legs, knees, ankles, toes): None, normal -Trunk Movements Neck, shoulders, hips: None,  normal -Overall Severity Severity of abnormal movements (highest score from questions above): None, normal Incapacitation due to abnormal movements: None, normal Patient's awareness of abnormal movements (rate only patient's report): No awareness -Dental Status Current problems with teeth and/or dentures?: No Does patient usually wear dentures?: No  CIWA: N/A COWS: N/A  Musculoskeletal: Strength & Muscle Tone: normal Gait & Station: normal Patient leans: N/A  Labs: Basic Metabolic Panel: BMP Latest Ref Rng & Units 08/15/2020  Glucose 70 - 99 mg/dL 295(A101(H)  BUN 6 - 20 mg/dL 10  Creatinine 2.130.44 - 0.861.00 mg/dL 5.780.62  Sodium 469135 - 629145 mmol/L 146(H)  Potassium 3.5 - 5.1 mmol/L 3.4(L)  Chloride 98 - 111 mmol/L 115(H)  CO2 22 - 32 mmol/L 23  Calcium 8.9 - 10.3 mg/dL 8.9    Complete Metabolic Panel: CMP Latest Ref Rng & Units 08/15/2020  Glucose 70 - 99 mg/dL 528(U101(H)  BUN 6 - 20 mg/dL 10  Creatinine 1.320.44 - 4.401.00 mg/dL 1.020.62  Sodium 725135 - 366145 mmol/L 146(H)  Potassium 3.5 - 5.1 mmol/L 3.4(L)  Chloride 98 - 111 mmol/L 115(H)  CO2 22 - 32 mmol/L 23  Calcium 8.9 - 10.3 mg/dL 8.9  Total Protein 6.5 - 8.1 g/dL 6.9  Total Bilirubin 0.3 - 1.2 mg/dL 0.5  Alkaline Phos 38 - 126 U/L 59  AST 15 - 41 U/L 26  ALT 0 - 44 U/L 31    Complete Blood Count: CBC Latest Ref Rng & Units 08/15/2020  WBC 4.0 - 10.5 K/uL 7.1  Hemoglobin 12.0 - 15.0 g/dL 44.012.2  Hematocrit 34.736.0 - 46.0 % 36.5  Platelets 150 - 400 K/uL 295    Urinalysis: No results found for: COLORURINE, APPEARANCEUR, LABSPEC, PHURINE, GLUCOSEU, HGBUR, BILIRUBINUR, KETONESUR, PROTEINUR, UROBILINOGEN, NITRITE, LEUKOCYTESUR Drugs of Abuse:     Component Value Date/Time   LABOPIA NONE DETECTED 08/16/2020 0559   COCAINSCRNUR NONE DETECTED 08/16/2020 0559   LABBENZ NONE DETECTED 08/16/2020 0559   AMPHETMU NONE DETECTED 08/16/2020 0559   THCU NONE DETECTED 08/16/2020 0559   LABBARB NONE DETECTED 08/16/2020 0559     Alcohol Level:    Component  Value Date/Time   ETH 31 (H) 08/15/2020 2127    Lipid Panel:     Component Value Date/Time   CHOL 154 08/19/2020 0611   TRIG 238 (H) 08/19/2020 0611   HDL 53 08/19/2020 0611   CHOLHDL 2.9 08/19/2020 0611   VLDL 48 (H) 08/19/2020 42590611  LDLCALC 53 08/19/2020 0611    TSH (08/19/20): 0.887  Hgb A1c (08/19/20): 5.6  Assessment/Plan:  Treatment Plan Summary:  Principal Problem:   Bipolar I disorder with mania (HCC) Active Problems:   Alcohol abuse with unspecified alcohol-induced disorder (HCC)   Polysubstance abuse (HCC)  Daily contact with patient to assess and evaluate symptoms and progress in treatment, medication management, and plan. Ms. Desaulniers is a 20 y.o. female with a past psychiatric history significant for bipolar disorder type I, polysubstance use disorder who is seen in follow-up. Pt was sedative this morning but has shown improved sx from Risperdal 4mg  at night and 2mg  in the morning and Lithium 450 mg  Last night:  Patient slept Number of Hours: 6.25. Stated that it was "good" Patient stated appetite is appropriate Patient is compliant with scheduled meds. PRNs: Librium, Vistaril, Trazodone, Zyprexa, Albuterol, Claritin    Plan:  Bipolar 1 disorder with mania -Cont. Lithium 450mg  daily at bedtime -Order lithium level  -Decrease Risperdal from 2 mg in the morning to 1mg  give her increase sedative state in the morning. -Continue 4 mg Risperdal at night   Somatic symptoms  Pt does not endorse somatic sx currently  -Cont PRNs: Librium, Vistaril, Trazodone, Zyprexa, Albuterol, Claritin   -Cont. Clotrimazole, Bactroban 2%, Neosporin for foot rash   Polysubstance use -Cont Gabapentin 300mg  TID PO -Cont. nicotine patch 21mg  transdermal daily  Discharge planning  Pt has shown improvement with scheduled medications but will want to continue to access her condition for the next few days. Possible discharge at end of week. Need to identify a living situation for  patient as she lacks a guardian. Pt has Medicaid -Discuss with social work on setting up a living situation with [facility] and whether adoptive father would be willing to assist with costs. Also discussed homeless shelter . Pt states her adoptive father will drive her there   Signed: , MS3 [facility] 08/25/20

## 2020-08-25 NOTE — Progress Notes (Signed)
[facility] Group Notes:  (Nursing/MHT/Case Management/Adjunct)  Date:  08/25/2020  Time:  2000 Type of Therapy:   wrap up group  Participation Level:  Active  Participation Quality:  Appropriate, Attentive, Monopolizing, Redirectable, Sharing, and Supportive  Affect:  Excited  Cognitive:  Alert  Insight:  Lacking  Engagement in Group:  Engaged  Modes of Intervention:  Clarification, Education, and Support  Summary of Progress/Problems: Positive thinking and self-care were discussed. Pt sharing she must be pregnant because of her increased appetite and bloated belly.   Alexandra Mcgee 08/25/2020, 8:40 PM

## 2020-08-26 ENCOUNTER — Inpatient Hospital Stay (HOSPITAL_COMMUNITY)
Admit: 2020-08-26 | Discharge: 2020-08-26 | Disposition: A | Discharge status: Home / Self Care | Payer: No Typology Code available for payment source | Attending: Emergency Medicine | Admitting: Emergency Medicine

## 2020-08-26 ENCOUNTER — Encounter (HOSPITAL_COMMUNITY): Payer: Self-pay | Admitting: Family Medicine

## 2020-08-26 ENCOUNTER — Other Ambulatory Visit (HOSPITAL_COMMUNITY): Payer: Self-pay

## 2020-08-26 LAB — URINALYSIS, ROUTINE W REFLEX MICROSCOPIC
Bilirubin Urine: NEGATIVE
Glucose, UA: NEGATIVE mg/dL
Hgb urine dipstick: NEGATIVE
Ketones, ur: NEGATIVE mg/dL
Leukocytes,Ua: NEGATIVE
Nitrite: NEGATIVE
Protein, ur: NEGATIVE mg/dL
Specific Gravity, Urine: 1.012 (ref 1.005–1.030)
pH: 6 (ref 5.0–8.0)

## 2020-08-26 LAB — LITHIUM LEVEL: Lithium Lvl: 0.37 mmol/L — ABNORMAL LOW (ref 0.60–1.20)

## 2020-08-26 LAB — BASIC METABOLIC PANEL
Anion gap: 8 (ref 5–15)
BUN: 23 mg/dL — ABNORMAL HIGH (ref 6–20)
CO2: 23 mmol/L (ref 22–32)
Calcium: 9.2 mg/dL (ref 8.9–10.3)
Chloride: 107 mmol/L (ref 98–111)
Creatinine, Ser: 0.63 mg/dL (ref 0.44–1.00)
GFR, Estimated: >60 mL/min (ref >60)
Glucose, Bld: 93 mg/dL (ref 70–99)
Potassium: 4 mmol/L (ref 3.5–5.1)
Sodium: 138 mmol/L (ref 135–145)

## 2020-08-26 LAB — HCG, QUANTITATIVE, PREGNANCY: hCG, Beta Chain, Quant, S: <1 m[IU]/mL (ref <5)

## 2020-08-26 LAB — TSH: TSH: 2.191 u[IU]/mL (ref 0.350–4.500)

## 2020-08-26 MED ORDER — CHLORDIAZEPOXIDE HCL 25 MG PO CAPS: 25.0000 mg | ORAL_CAPSULE | Freq: Four times a day (QID) | ORAL | Status: DC | PRN

## 2020-08-26 MED ORDER — LITHIUM CARBONATE ER 300 MG PO TBCR
600.0000 mg | EXTENDED_RELEASE_TABLET | Freq: Every day | ORAL | Status: DC
  Administered 2020-08-26 – 2020-08-28 (×3): 600 mg via ORAL
  Filled 2020-08-26 (×7): qty 2

## 2020-08-26 NOTE — Progress Notes (Signed)
Adult Psychoeducational Group Note  Date:  08/26/2020 Time:  3:57 PM  Group Topic/Focus:  Goals Group:   The focus of this group is to help patients establish daily goals to achieve during treatment and discuss how the patient can incorporate goal setting into their daily lives to aide in recovery.  Participation Level:  Active  Participation Quality:  Appropriate  Affect:  Appropriate  Cognitive:  Appropriate  Insight: Appropriate  Engagement in Group:  Engaged  Modes of Intervention:  Discussion  Additional Comments:  Patient attended morning goal and orientation group and participated.    Yordan Martindale W Tranisha Tissue 08/26/2020, 3:57 PM

## 2020-08-26 NOTE — Progress Notes (Signed)
Recreation Therapy Notes  Animal-Assisted Activity (AAA) Program Checklist/Progress Notes Patient Eligibility Criteria Checklist & Daily Group note for Rec Tx Intervention  Date: 7.19.22 Time: 1430 Location: 300 Hall Dayroom   AAA/T Program Assumption of Risk Form signed by Patient/ or Parent Legal Guardian  YES   Patient is free of allergies or severe asthma  YES   Patient reports no fear of animals  YES  Patient reports no history of cruelty to animals  YES  Patient understands his/her participation is voluntary  YES  Patient washes hands before animal contact YES   Patient washes hands after animal contact YES  Behavioral Response: Engaged  Education: Hand Washing, Appropriate Animal Interaction   Education Outcome: Acknowledges understanding/In group clarification offered/Needs additional education.   Clinical Observations/Feedback: Pt attended and was attentive during the activity.    Aedyn Kempfer, LRT/CTRS        Felipe Cabell A 08/26/2020 3:29 PM 

## 2020-08-26 NOTE — Progress Notes (Signed)
   08/25/20 2103  Psych Admission Type (Psych Patients Only)  Admission Status Involuntary  Psychosocial Assessment  Patient Complaints Anxiety  Eye Contact Fair;Glaring  Facial Expression Animated  Affect Appropriate to circumstance  Speech Logical/coherent  Interaction Assertive;Attention-seeking;Intrusive  Motor Activity Restless;Pacing  Appearance/Hygiene Unremarkable  Behavior Characteristics Cooperative;Appropriate to situation  Mood Pleasant  Thought Chartered certified accountant of ideas  Content Compulsions  Delusions None reported or observed  Perception Hallucinations  Hallucination Auditory  Judgment Poor  Confusion None  Danger to Self  Current suicidal ideation? Denies  Danger to Others  Danger to Others None reported or observed

## 2020-08-26 NOTE — [facility] Group Notes (Addendum)
Pt did not attend wrap up group this evening. Pt was in bed sleeping.  
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
## 2020-08-26 NOTE — Progress Notes (Signed)
Today on patient's wrists assessment for right wrist tenderness due to reported fall in the shower from standing height the night before.  Patient stated that the pain compared to initial fall has improved, and not worsened.  On physical examination, there is no swelling, redness, bruises, or any discolorations to the right wrist and adjacent distal and proximal joints. The skin was intact. Both wrists and distal and proximal joints were symmetrical in appearance.  The patient was tender to palpation on the dorsal surface of the right wrist only. Patient denied tenderness to adjacent distal and proximal joints. Patient's right wrist and adjacent distal and proximal joints had full active and passive range of motion, and was able to bear weight.  Patient denied symptoms of neuro or vascular compromise. She denied any numbness, tingling, burning, weakness, or skin color changes.
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
## 2020-08-27 ENCOUNTER — Encounter (HOSPITAL_COMMUNITY): Payer: Self-pay | Admitting: Family Medicine

## 2020-08-27 ENCOUNTER — Inpatient Hospital Stay (HOSPITAL_COMMUNITY): Payer: No Typology Code available for payment source

## 2020-08-27 DIAGNOSIS — R000 Tachycardia, unspecified: Secondary | ICD-10-CM

## 2020-08-27 DIAGNOSIS — F312 Bipolar disorder, current episode manic severe with psychotic features: Secondary | ICD-10-CM | POA: Diagnosis present

## 2020-08-27 LAB — BASIC METABOLIC PANEL
Anion gap: 6 (ref 5–15)
BUN: 16 mg/dL (ref 6–20)
CO2: 24 mmol/L (ref 22–32)
Calcium: 8.6 mg/dL — ABNORMAL LOW (ref 8.9–10.3)
Chloride: 109 mmol/L (ref 98–111)
Creatinine, Ser: 0.67 mg/dL (ref 0.44–1.00)
GFR, Estimated: >60 mL/min (ref >60)
Glucose, Bld: 69 mg/dL — ABNORMAL LOW (ref 70–99)
Potassium: 3.8 mmol/L (ref 3.5–5.1)
Sodium: 139 mmol/L (ref 135–145)

## 2020-08-27 LAB — CBC WITH DIFFERENTIAL/PLATELET
Abs Immature Granulocytes: 0.04 10*3/uL (ref 0.00–0.07)
Basophils Absolute: 0 10*3/uL (ref 0.0–0.1)
Basophils Relative: 0 %
Eosinophils Absolute: 0.1 10*3/uL (ref 0.0–0.5)
Eosinophils Relative: 1 %
HCT: 35 % — ABNORMAL LOW (ref 36.0–46.0)
Hemoglobin: 11.3 g/dL — ABNORMAL LOW (ref 12.0–15.0)
Immature Granulocytes: 0 %
Lymphocytes Relative: 34 %
Lymphs Abs: 3.7 10*3/uL (ref 0.7–4.0)
MCH: 31.7 pg (ref 26.0–34.0)
MCHC: 32.3 g/dL (ref 30.0–36.0)
MCV: 98 fL (ref 80.0–100.0)
Monocytes Absolute: 0.8 10*3/uL (ref 0.1–1.0)
Monocytes Relative: 7 %
Neutro Abs: 6.3 10*3/uL (ref 1.7–7.7)
Neutrophils Relative %: 58 %
Platelets: 286 10*3/uL (ref 150–400)
RBC: 3.57 MIL/uL — ABNORMAL LOW (ref 3.87–5.11)
RDW: 14.2 % (ref 11.5–15.5)
WBC: 11 10*3/uL — ABNORMAL HIGH (ref 4.0–10.5)
nRBC: 0 % (ref 0.0–0.2)

## 2020-08-27 LAB — GC/CHLAMYDIA PROBE AMP (~~LOC~~) NOT AT ARMC
Chlamydia: NEGATIVE
Comment: NEGATIVE
Comment: NORMAL
Neisseria Gonorrhea: NEGATIVE

## 2020-08-27 LAB — D-DIMER, QUANTITATIVE: D-Dimer, Quant: 0.57 ug/mL-FEU — ABNORMAL HIGH (ref 0.00–0.50)

## 2020-08-27 MED ORDER — SODIUM CHLORIDE (PF) 0.9 % IJ SOLN
INTRAMUSCULAR | Status: AC
  Filled 2020-08-27: qty 50

## 2020-08-27 MED ORDER — SODIUM CHLORIDE 0.9 % IV BOLUS
1000.0000 mL | Freq: Once | INTRAVENOUS | Status: AC
  Administered 2020-08-27: 1000 mL via INTRAVENOUS

## 2020-08-27 MED ORDER — PROPRANOLOL HCL 10 MG PO TABS
10.0000 mg | ORAL_TABLET | Freq: Two times a day (BID) | ORAL | Status: DC
  Administered 2020-08-27 – 2020-08-29 (×4): 10 mg via ORAL
  Filled 2020-08-27 (×9): qty 1

## 2020-08-27 MED ORDER — BENZTROPINE MESYLATE 0.5 MG PO TABS
0.5000 mg | ORAL_TABLET | Freq: Two times a day (BID) | ORAL | Status: DC
  Administered 2020-08-27 – 2020-08-29 (×4): 0.5 mg via ORAL
  Filled 2020-08-27 (×9): qty 1

## 2020-08-27 MED ORDER — DICLOFENAC SODIUM 1 % EX GEL
2.0000 g | Freq: Four times a day (QID) | CUTANEOUS | Status: DC | PRN
  Filled 2020-08-27: qty 100

## 2020-08-27 MED ORDER — TRAZODONE HCL 50 MG PO TABS: 25.0000 mg | ORAL_TABLET | Freq: Every evening | ORAL | Status: DC | PRN

## 2020-08-27 MED ORDER — IOHEXOL 350 MG/ML SOLN
100.0000 mL | Freq: Once | INTRAVENOUS | Status: AC | PRN
  Administered 2020-08-27: 100 mL via INTRAVENOUS

## 2020-08-27 NOTE — Progress Notes (Signed)
PT Cancellation Note  Patient Details Name: Abie Killian MRN: [id number] DOB: 2000/05/12   Cancelled Treatment:    Reason Eval/Treat Not Completed: Other (comment)OT at [facility] will take over case. PT will sign off.   Rada Hay 08/27/2020, 10:57 AM  Blanchard Kelch PT Acute Rehabilitation Services Pager [phone] Office [phone]

## 2020-08-27 NOTE — [facility] Group Notes (Signed)
[facility] LCSW Group Therapy Note   Type of Therapy and Topic:  Group Therapy:  Building Supports  Participation Level:  Did Not Attend   Description of Group:  Patients in this group were introduced to the idea of adding a variety of healthy supports to address the various needs in their lives.  Different types of support were defined and described, and patients were asked to act out what each type could be.  Patients discussed what additional healthy supports could be helpful in their recovery and wellness after discharge in order to prevent future hospitalizations.   An emphasis was placed on following up with the discharge plan when they leave the hospital in order to continue becoming healthier and happier.  Therapeutic Goals: 1)  demonstrate the importance of adding supports  2)  discuss 4 definitions of support  3)  identify the patient's current level of healthy support and   4)  elicit commitments to add one healthy support   Summary of Patient Progress:  Did Not Attend  Therapeutic Modalities:   Motivational Interviewing Brief Solution-Focused Therapy  Akeiba Axelson, LCSWA Clinicial Social Worker  Health 

## 2020-08-27 NOTE — Progress Notes (Signed)
Trialing pt off unit for breakfast this morning.

## 2020-08-27 NOTE — Progress Notes (Signed)
Recreation Therapy Notes  Date: 7.20.22 Time: 0930 Location: 300 Hall Dayroom  Group Topic: Stress Management   Goal Area(s) Addresses:  Patient will actively participate in stress management techniques presented during session.  Patient will successfully identify benefit of practicing stress management post d/c.   Behavioral Response: Appropriate  Intervention: Guided exercise with ambient sound and script  Activity :Guided Imagery  LRT provided an introduction on practice of visualization via guided imagery. Patient was asked to participate in the technique introduced during session. Patients were given suggestions of ways to access scripts post d/c and encouraged to explore Youtube and other apps available on smartphones, tablets, and computers.   Education:  Stress Management, Discharge Planning.   Education Outcome: Acknowledges education  Clinical Observations/Feedback: Patient actively engaged in technique introduced and expressed no concerns.       Caroll Rancher,  LRT/CTRS         Caroll Rancher A 08/27/2020 11:51 AM
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
## 2020-08-27 NOTE — Plan of Care (Cosign Needed)
  Problem: Tachycardia  Vitals obtained this morning revealed pt's HR was 130 and BP was 126/91 while standing. A 2nd check at noon reported HR was 138 and BP was 125/88.   Orthostatic vitals were ordered and reported as such:  Laying: HR: 92, BP 139/66 Standing: HR: 140, BP 137/92 Sitting: HR: 125, BP: 131/90  Pt denies chest discomfort, palpitations, or SOB  EKG: Pt last EKG on 08/22/20 revealed:  -sinus tachycardia 101 BPM, QTc interval of 474   EKG ordered today (08/27/20) revealed the following findings: -sinus tachycardia 114 BPM, QTc 457 m     -sinus tachycardia 117 BPM, QTc 454 ms  Outcome:  -Consulted with hospital Dr. Ronaldo Miyamoto and he had concern for orthostasis. Pt is now going to be sent to [facility] ED to receive fluids and be reassessed.    Problem: R wrist soreness   Pt endorses R wrist soreness after falling in the shower. On PE, R wrist tender upon palpation on dorsal side. Full ROM. Able to bear weight. No gross abnormalities. R Wrist X-ray was ordered  R Wrist X-ray:  Impression:  Status post surgical fixation of old distal right radial and ulnar fractures. No acute fracture or dislocation is noted. Sclerotic density is noted in ulnar styloid region which may be posttraumatic in etiology, but if patient is persistently symptomatic in this area, further evaluation with bone scan or MRI is recommended to rule out possible neoplasm.  Outcome:  -OT will come to evaluate pt's wrist and access need for a splint -Pt will continue Tylenol PRN,  -Start Voltaren gel PRN q6 hours -Pt encouraged to see PCP after discharge for recheck of sclerotic density in ulnar styloid region for additional w/u as indicated
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
## 2020-08-27 NOTE — BHH Group Notes (Signed)
Adult Psychoeducational Group Note  Date:  08/27/2020 Time:  10:11 AM  Group Topic/Focus:  Orientation:   The focus of this group is to educate the patient on the purpose and policies of crisis stabilization and provide a format to answer questions about their admission.  The group details unit policies and expectations of patients while admitted.  Participation Level:  Active  Participation Quality:  Appropriate  Affect:  Appropriate  Cognitive:  Appropriate  Insight: Appropriate  Engagement in Group:  Engaged  Modes of Intervention:  Discussion  Additional Comments:  Patient attended orientation group and stayed engaged and appropriate the duration of the group. Patient's goal was to take her medication.   Maya Scholer T Lorraine Lax 08/27/2020, 10:11 AM

## 2020-08-27 NOTE — Progress Notes (Signed)
   08/27/20 2030  Psych Admission Type (Psych Patients Only)  Admission Status Involuntary  Psychosocial Assessment  Patient Complaints Self-harm thoughts  Eye Contact Fair  Facial Expression Flat  Affect Flat  Speech Logical/coherent  Interaction Assertive  Motor Activity Slow  Appearance/Hygiene Unremarkable  Behavior Characteristics Cooperative  Mood Preoccupied  Thought Process  Coherency WDL  Content WDL  Delusions None reported or observed  Perception Hallucinations  Hallucination Auditory;Visual (sees her grandmother's face and hears command voices telling her to kill herself "Go ahead, do it.")  Judgment Limited  Confusion None  Danger to Self  Current suicidal ideation? Verbalizes  Self-Injurious Behavior Some self-injurious ideation observed or expressed.  No lethal plan expressed   Agreement Not to Harm Self Yes  Description of Agreement pt agrees to approach staff  Danger to Others  Danger to Others None reported or observed   Pt back from ED and VS assessed. Pt denies HI, pain. Pt endorses SI d/t hearing command AH and that people weren't listening to her earlier in the day. "I said I felt something was wrong and no one was listening to me. I put a washcloth around my neck but no one cared. The voices were telling me, "Go ahead, do it." Pt has flat affect. Delayed responses. Pt limited and things explained more than once. Pt on CO while awake to ensure she is safe after events of today. Pt had syncopal episode earlier and was sent to ED for fluids and lab work. Removed pencils, hair bands, towels, washcloths from patient room. Explained to pt this was to keep her safe. Pt verbalized understanding.

## 2020-08-27 NOTE — Progress Notes (Signed)
Nursing 1:1 note D: Pt returned from ED. Pt says voices telling her to hurt herself. A: Pt on close observation while awake for safety tonight d/t thoughts of wanting to hurt herself R: Pt remains safe on unit
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
## 2020-08-28 LAB — URINE CULTURE: Culture: NO GROWTH

## 2020-08-28 MED ORDER — ATROPINE SULFATE 1 % OP SOLN
1.0000 [drp] | Freq: Two times a day (BID) | OPHTHALMIC | Status: DC | PRN
  Administered 2020-08-28 (×2): 1 [drp] via SUBLINGUAL
  Filled 2020-08-28: qty 2

## 2020-08-28 MED ORDER — GABAPENTIN 100 MG PO CAPS
200.0000 mg | ORAL_CAPSULE | Freq: Three times a day (TID) | ORAL | Status: DC
  Administered 2020-08-28 – 2020-08-29 (×3): 200 mg via ORAL
  Filled 2020-08-28 (×10): qty 2

## 2020-08-28 MED ORDER — RISPERIDONE 1 MG PO TBDP
3.0000 mg | ORAL_TABLET | Freq: Every day | ORAL | Status: DC
  Administered 2020-08-28: 3 mg via ORAL
  Filled 2020-08-28 (×3): qty 3

## 2020-08-28 NOTE — [facility] Group Notes (Signed)
[facility] Group Notes:  (Nursing/MHT/Case Management/Adjunct)  Date:  08/28/2020  Time:  10:22 AM  Type of Therapy:  Group Therapy  Participation Level:  Minimal  Participation Quality:  Inattentive  Affect:  Blunted  Cognitive:  Lacking  Insight:  Lacking  Engagement in Group:  Lacking  Modes of Intervention:  Discussion  Summary of Progress/Problems:pt came late to group. States she feels depressed and her goal for today is to '' try not to cut'' we discussed coping skills which are helpful not harmful and pt identified coloring as a healthy coping skill.  Malva Limes 08/28/2020, 10:22 AM

## 2020-08-28 NOTE — Progress Notes (Signed)
Nursing 1:1 note D:Pt seen at med window. Pt still flat and blunted. Pt says voices still present and telling her to harm herself. A: Close observation continues for safety  R: pt remains safe
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
## 2020-08-28 NOTE — [facility] Group Notes (Signed)
[facility] Group Notes:  (Nursing/MHT/Case Management/Adjunct)  Date:  08/28/2020  Time:  8:48 PM  Type of Therapy:  Group Therapy  Participation Level:  Active  Participation Quality:  Appropriate  Affect:  Appropriate  Cognitive:  Alert  Insight:  Improving  Engagement in Group:  Developing/Improving  Modes of Intervention:  Discussion  Summary of Progress/Problems:  Lorita Officer 08/28/2020, 8:48 PM

## 2020-08-28 NOTE — B[facility] Counselor (Signed)
CSW had pt sign ROI sent over by [facility] and faxed the signed consent back to them   Fredirick Lathe, [employer] Clinicial Social Worker [employer]

## 2020-08-28 NOTE — Progress Notes (Signed)
     08/28/20 2157  Psych Admission Type (Psych Patients Only)  Admission Status Involuntary  Psychosocial Assessment  Patient Complaints Anxiety;Hyperactivity  Eye Contact Fair  Facial Expression Flat  Affect Flat  Speech Logical/coherent  Interaction Assertive  Motor Activity Slow  Appearance/Hygiene Unremarkable  Behavior Characteristics Cooperative;Hyperactive  Mood Preoccupied;Pleasant  Thought Process  Coherency WDL  Content WDL  Delusions None reported or observed  Perception Hallucinations  Hallucination Auditory;Visual  Judgment Limited  Confusion None  Danger to Self  Current suicidal ideation? Passive  Self-Injurious Behavior Some self-injurious ideation observed or expressed.  No lethal plan expressed   Agreement Not to Harm Self Yes  Description of Agreement verbal contract not to harm self or others  Danger to Others  Danger to Others None reported or observed

## 2020-08-28 NOTE — Progress Notes (Signed)
Nursing 1:1 note D:Pt observed sleeping in bed with eyes closed. RR even and unlabored. No distress noted. A: close observation while awake continues for safety  R: pt remains safe

## 2020-08-28 NOTE — Plan of Care (Signed)
  Problem: Coping: Goal: Coping ability will improve Outcome: Progressing   Problem: Health Behavior/Discharge Planning: Goal: Compliance with prescribed medication regimen will improve Outcome: Progressing   Problem: Nutritional: Goal: Ability to achieve adequate nutritional intake will improve Outcome: Progressing

## 2020-08-28 NOTE — Progress Notes (Signed)
D. Pt presents with a flat affect, depressed mood- is very slow to respond to questions. When asked how she was feeling, pt stated, "not good", reported that she was drooling. Pt observed drooling when at the medication window. Pt denied SI/HI, but does endorse hearing command AH, but agrees to contact staff before acting on any harmful thoughts.  A. Labs and vitals monitored.  Pt supported emotionally and encouraged to express concerns and ask questions.   R. Pt remains safe with 15 minute checks. Will continue POC.

## 2020-08-28 NOTE — Progress Notes (Signed)
   08/28/20 1300  Psych Admission Type (Psych Patients Only)  Admission Status Involuntary  Psychosocial Assessment  Patient Complaints Self-harm thoughts  Eye Contact Fair  Facial Expression Flat  Affect Flat  Speech Logical/coherent  Interaction Assertive  Motor Activity Slow  Appearance/Hygiene Unremarkable  Behavior Characteristics Cooperative  Mood Preoccupied  Thought Process  Coherency WDL  Content WDL  Delusions None reported or observed  Perception Hallucinations  Hallucination Auditory;Visual (sees her grandmother's face and hears command voices telling her to kill herself "Go ahead, do it.")  Judgment Limited  Confusion None  Danger to Self  Current suicidal ideation? Passive  Self-Injurious Behavior Some self-injurious ideation observed or expressed.  No lethal plan expressed   Agreement Not to Harm Self Yes  Description of Agreement pt agrees to approach staff  Danger to Others  Danger to Others None reported or observed

## 2020-08-29 ENCOUNTER — Telehealth: Payer: Self-pay | Admitting: *Deleted

## 2020-08-29 ENCOUNTER — Encounter: Payer: Self-pay | Admitting: *Deleted

## 2020-08-29 ENCOUNTER — Emergency Department (HOSPITAL_COMMUNITY): Payer: Medicaid Other

## 2020-08-29 ENCOUNTER — Other Ambulatory Visit: Payer: Self-pay

## 2020-08-29 ENCOUNTER — Encounter (HOSPITAL_COMMUNITY): Payer: Self-pay

## 2020-08-29 ENCOUNTER — Emergency Department (HOSPITAL_COMMUNITY)
Admission: EM | Admit: 2020-08-29 | Discharge: 2020-08-29 | Disposition: A | Discharge status: Home / Self Care | Payer: Medicaid Other | Attending: Emergency Medicine | Admitting: Emergency Medicine

## 2020-08-29 DIAGNOSIS — J45909 Unspecified asthma, uncomplicated: Secondary | ICD-10-CM | POA: Diagnosis not present

## 2020-08-29 DIAGNOSIS — R102 Pelvic and perineal pain: Secondary | ICD-10-CM | POA: Diagnosis not present

## 2020-08-29 DIAGNOSIS — M542 Cervicalgia: Secondary | ICD-10-CM | POA: Diagnosis not present

## 2020-08-29 DIAGNOSIS — S8991XA Unspecified injury of right lower leg, initial encounter: Secondary | ICD-10-CM | POA: Diagnosis present

## 2020-08-29 DIAGNOSIS — R000 Tachycardia, unspecified: Secondary | ICD-10-CM | POA: Diagnosis not present

## 2020-08-29 DIAGNOSIS — S80211A Abrasion, right knee, initial encounter: Secondary | ICD-10-CM | POA: Insufficient documentation

## 2020-08-29 DIAGNOSIS — M25511 Pain in right shoulder: Secondary | ICD-10-CM | POA: Diagnosis not present

## 2020-08-29 DIAGNOSIS — F17210 Nicotine dependence, cigarettes, uncomplicated: Secondary | ICD-10-CM | POA: Insufficient documentation

## 2020-08-29 DIAGNOSIS — R079 Chest pain, unspecified: Secondary | ICD-10-CM | POA: Insufficient documentation

## 2020-08-29 DIAGNOSIS — R519 Headache, unspecified: Secondary | ICD-10-CM | POA: Insufficient documentation

## 2020-08-29 DIAGNOSIS — Z79899 Other long term (current) drug therapy: Secondary | ICD-10-CM | POA: Insufficient documentation

## 2020-08-29 DIAGNOSIS — R109 Unspecified abdominal pain: Secondary | ICD-10-CM | POA: Insufficient documentation

## 2020-08-29 DIAGNOSIS — N9489 Other specified conditions associated with female genital organs and menstrual cycle: Secondary | ICD-10-CM | POA: Diagnosis not present

## 2020-08-29 DIAGNOSIS — S80212A Abrasion, left knee, initial encounter: Secondary | ICD-10-CM | POA: Insufficient documentation

## 2020-08-29 DIAGNOSIS — J449 Chronic obstructive pulmonary disease, unspecified: Secondary | ICD-10-CM | POA: Diagnosis not present

## 2020-08-29 LAB — ACETAMINOPHEN LEVEL: Acetaminophen (Tylenol), Serum: <10 ug/mL — ABNORMAL LOW (ref 10–30)

## 2020-08-29 LAB — BASIC METABOLIC PANEL
Anion gap: 7 (ref 5–15)
BUN: 28 mg/dL — ABNORMAL HIGH (ref 6–20)
CO2: 24 mmol/L (ref 22–32)
Calcium: 9.1 mg/dL (ref 8.9–10.3)
Chloride: 107 mmol/L (ref 98–111)
Creatinine, Ser: 0.76 mg/dL (ref 0.44–1.00)
GFR, Estimated: >60 mL/min (ref >60)
Glucose, Bld: 92 mg/dL (ref 70–99)
Potassium: 3.9 mmol/L (ref 3.5–5.1)
Sodium: 138 mmol/L (ref 135–145)

## 2020-08-29 LAB — CBC WITH DIFFERENTIAL/PLATELET
Abs Immature Granulocytes: 0.04 10*3/uL (ref 0.00–0.07)
Basophils Absolute: 0 10*3/uL (ref 0.0–0.1)
Basophils Relative: 0 %
Eosinophils Absolute: 0.1 10*3/uL (ref 0.0–0.5)
Eosinophils Relative: 1 %
HCT: 37.4 % (ref 36.0–46.0)
Hemoglobin: 12.6 g/dL (ref 12.0–15.0)
Immature Granulocytes: 0 %
Lymphocytes Relative: 29 %
Lymphs Abs: 3 10*3/uL (ref 0.7–4.0)
MCH: 32.1 pg (ref 26.0–34.0)
MCHC: 33.7 g/dL (ref 30.0–36.0)
MCV: 95.2 fL (ref 80.0–100.0)
Monocytes Absolute: 0.6 10*3/uL (ref 0.1–1.0)
Monocytes Relative: 6 %
Neutro Abs: 6.4 10*3/uL (ref 1.7–7.7)
Neutrophils Relative %: 64 %
Platelets: 290 10*3/uL (ref 150–400)
RBC: 3.93 MIL/uL (ref 3.87–5.11)
RDW: 13.9 % (ref 11.5–15.5)
WBC: 10.2 10*3/uL (ref 4.0–10.5)
nRBC: 0 % (ref 0.0–0.2)

## 2020-08-29 LAB — COMPREHENSIVE METABOLIC PANEL
ALT: 44 U/L (ref 0–44)
AST: 28 U/L (ref 15–41)
Albumin: 4.1 g/dL (ref 3.5–5.0)
Alkaline Phosphatase: 53 U/L (ref 38–126)
Anion gap: 10 (ref 5–15)
BUN: 22 mg/dL — ABNORMAL HIGH (ref 6–20)
CO2: 21 mmol/L — ABNORMAL LOW (ref 22–32)
Calcium: 9.2 mg/dL (ref 8.9–10.3)
Chloride: 109 mmol/L (ref 98–111)
Creatinine, Ser: 0.75 mg/dL (ref 0.44–1.00)
GFR, Estimated: >60 mL/min (ref >60)
Glucose, Bld: 97 mg/dL (ref 70–99)
Potassium: 3.4 mmol/L — ABNORMAL LOW (ref 3.5–5.1)
Sodium: 140 mmol/L (ref 135–145)
Total Bilirubin: 0.5 mg/dL (ref 0.3–1.2)
Total Protein: 7.1 g/dL (ref 6.5–8.1)

## 2020-08-29 LAB — TSH: TSH: 3.032 u[IU]/mL (ref 0.350–4.500)

## 2020-08-29 LAB — I-STAT BETA HCG BLOOD, ED (MC, WL, AP ONLY): I-stat hCG, quantitative: <5 m[IU]/mL (ref <5)

## 2020-08-29 LAB — SALICYLATE LEVEL: Salicylate Lvl: <7 mg/dL — ABNORMAL LOW (ref 7.0–30.0)

## 2020-08-29 LAB — LACTIC ACID, PLASMA
Lactic Acid, Venous: 2.2 mmol/L (ref 0.5–1.9)
Lactic Acid, Venous: 2.4 mmol/L (ref 0.5–1.9)

## 2020-08-29 LAB — LITHIUM LEVEL: Lithium Lvl: 0.53 mmol/L — ABNORMAL LOW (ref 0.60–1.20)

## 2020-08-29 LAB — ETHANOL: Alcohol, Ethyl (B): <10 mg/dL (ref <10)

## 2020-08-29 MED ORDER — PROPRANOLOL HCL 10 MG PO TABS: 10.0000 mg | ORAL_TABLET | Freq: Two times a day (BID) | ORAL | 0 refills | Status: AC

## 2020-08-29 MED ORDER — SODIUM CHLORIDE 0.9 % IV BOLUS
1000.0000 mL | Freq: Once | INTRAVENOUS | Status: AC
  Administered 2020-08-29: 1000 mL via INTRAVENOUS

## 2020-08-29 MED ORDER — LITHIUM CARBONATE ER 300 MG PO TBCR: 600.0000 mg | EXTENDED_RELEASE_TABLET | Freq: Every day | ORAL | 0 refills | Status: AC

## 2020-08-29 MED ORDER — STERILE WATER FOR INJECTION IJ SOLN
INTRAMUSCULAR | Status: AC
  Filled 2020-08-29: qty 10

## 2020-08-29 MED ORDER — IOHEXOL 350 MG/ML SOLN
125.0000 mL | Freq: Once | INTRAVENOUS | Status: AC | PRN
  Administered 2020-08-29: 80 mL via INTRAVENOUS

## 2020-08-29 MED ORDER — METRONIDAZOLE 500 MG PO TABS
2000.0000 mg | ORAL_TABLET | Freq: Once | ORAL | Status: DC
  Filled 2020-08-29: qty 4

## 2020-08-29 MED ORDER — AZITHROMYCIN 250 MG PO TABS
1000.0000 mg | ORAL_TABLET | Freq: Once | ORAL | Status: AC
  Administered 2020-08-29: 1000 mg via ORAL
  Filled 2020-08-29: qty 4

## 2020-08-29 MED ORDER — ULIPRISTAL ACETATE 30 MG PO TABS
30.0000 mg | ORAL_TABLET | Freq: Once | ORAL | Status: AC
  Administered 2020-08-29: 30 mg via ORAL
  Filled 2020-08-29: qty 1

## 2020-08-29 MED ORDER — GABAPENTIN 100 MG PO CAPS: 200.0000 mg | ORAL_CAPSULE | Freq: Three times a day (TID) | ORAL | 0 refills | Status: AC

## 2020-08-29 MED ORDER — RISPERIDONE 3 MG PO TBDP
3.0000 mg | ORAL_TABLET | Freq: Every day | ORAL | Status: DC
  Filled 2020-08-29: qty 3

## 2020-08-29 MED ORDER — LIDOCAINE HCL (PF) 1 % IJ SOLN
1.0000 mL | Freq: Once | INTRAMUSCULAR | Status: AC
  Administered 2020-08-29: 1 mL
  Filled 2020-08-29: qty 30

## 2020-08-29 MED ORDER — RISPERIDONE 2 MG PO TBDP: 3.0000 mg | ORAL_TABLET | Freq: Two times a day (BID) | ORAL | Status: DC

## 2020-08-29 MED ORDER — NICOTINE 21 MG/24HR TD PT24: 21.0000 mg | MEDICATED_PATCH | Freq: Every day | TRANSDERMAL | 0 refills | Status: AC

## 2020-08-29 MED ORDER — CEFTRIAXONE SODIUM 1 G IJ SOLR
500.0000 mg | Freq: Once | INTRAMUSCULAR | Status: AC
  Administered 2020-08-29: 500 mg via INTRAMUSCULAR
  Filled 2020-08-29: qty 10

## 2020-08-29 MED ORDER — KETOROLAC TROMETHAMINE 30 MG/ML IJ SOLN
30.0000 mg | Freq: Once | INTRAMUSCULAR | Status: AC
  Administered 2020-08-29: 30 mg via INTRAVENOUS
  Filled 2020-08-29: qty 1

## 2020-08-29 MED ORDER — RISPERIDONE 3 MG PO TBDP: 3.0000 mg | ORAL_TABLET | Freq: Every day | ORAL | 0 refills | Status: AC

## 2020-08-29 MED ORDER — BENZTROPINE MESYLATE 0.5 MG PO TABS: 0.5000 mg | ORAL_TABLET | Freq: Two times a day (BID) | ORAL | 0 refills | Status: AC

## 2020-08-29 NOTE — Progress Notes (Signed)
  [facility] Adult Case Management Discharge Plan :  Will you be returning to the same living situation after discharge:  No. To [facility] At discharge, do you have transportation home?: No. Safe Transport will be arranged Do you have the ability to pay for your medications: Yes,  Medicaid  Release of information consent forms completed and in the chart;  Patient's signature needed at discharge.  Patient to Follow up at:  Follow-up Information     [facility] Follow up.   Specialty: Behavioral Health Why: Please go to this provider for therapy and medication management services during walk in hours:  Monday through Wednesday from 8:00 am to 11:00 am.  Services are provided on a first come, first served basis. First appointments at this facility are walk-in. This facility also offers various group therapy options. Contact information: [address] [place] [address] [phone]        [facility] Health Care Coordinator: Minus Liberty. Call.   Why: Please contact Ms. Okeowo once you discharge to follow up as she is your case manager through your state provider. Contact information: Email:[email or web address]  [phone] [email or web address]                Next level of care provider has access to [facility] Link:yes  Safety Planning and Suicide Prevention discussed: Yes,  father     Has patient been referred to the Quitline?: Patient refused referral  Patient has been referred for addiction treatment: Pt. refused referral  Chrys Racer 08/29/2020, 9:46 AM

## 2020-08-29 NOTE — Progress Notes (Addendum)
    08/29/20 0623  Vital Signs  Temp 97.9 F (36.6 C)  Temp Source Oral  Pulse Rate (!) 107  BP 99/75  BP Method Automatic      08/29/20 0625  Vital Signs  Pulse Rate (!) 133  Pulse Rate Source Monitor  Resp 16  BP 113/81  BP Location Right Arm  BP Method Automatic  Patient Position (if appropriate) Standing  Oxygen Therapy  SpO2 97 %    HR Tachycardic. Will recheck after breakfast. Day shift RN made aware.  Day shift RN will recheck.
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
## 2020-08-29 NOTE — Tx Team (Signed)
Interdisciplinary Treatment and Diagnostic Plan Update  08/29/2020 Time of Session: 9:00am  Alexandra Mcgee MRN: [id number]  Principal Diagnosis: Bipolar I disorder, current or most recent episode manic, with psychotic features (HCC)  Secondary Diagnoses: Principal Problem:   Bipolar I disorder, current or most recent episode manic, with psychotic features (HCC) Active Problems:   Polysubstance abuse (HCC)   Suicidal ideation   Current Medications:  Current Facility-Administered Medications  Medication Dose Route Frequency Provider Last Rate Last Admin   acetaminophen (TYLENOL) tablet 650 mg  650 mg Oral Q6H PRN Novella Olive, NP   650 mg at 08/26/20 0925   albuterol (VENTOLIN HFA) 108 (90 Base) MCG/ACT inhaler 2 puff  2 puff Inhalation Q12H PRN Princess Bruins, DO   2 puff at 08/28/20 1623   alum & mag hydroxide-simeth (MAALOX/MYLANTA) 200-200-20 MG/5ML suspension 30 mL  30 mL Oral Q4H PRN Novella Olive, NP       atropine 1 % ophthalmic solution 1 drop  1 drop Sublingual BID PRN Bartholomew Crews E, MD   1 drop at 08/28/20 2157   benztropine (COGENTIN) tablet 0.5 mg  0.5 mg Oral BID Bartholomew Crews E, MD   0.5 mg at 08/29/20 9509   clotrimazole (LOTRIMIN) 1 % cream   Topical BID Antonieta Pert, MD   1 application at 08/28/20 3267   diclofenac Sodium (VOLTAREN) 1 % topical gel 2 g  2 g Topical Q6H PRN Comer Locket, MD       folic acid (FOLVITE) tablet 1 mg  1 mg Oral Daily Princess Bruins, DO   1 mg at 08/29/20 0805   gabapentin (NEURONTIN) capsule 200 mg  200 mg Oral TID Princess Bruins, DO   200 mg at 08/29/20 1245   haloperidol lactate (HALDOL) injection 10 mg  10 mg Intramuscular Q6H PRN Antonieta Pert, MD       hydrOXYzine (ATARAX/VISTARIL) tablet 25 mg  25 mg Oral TID PRN Novella Olive, NP   25 mg at 08/28/20 2110   lithium carbonate (LITHOBID) CR tablet 600 mg  600 mg Oral QHS Mason Jim, Amy E, MD   600 mg at 08/28/20 2110   magnesium hydroxide (MILK OF MAGNESIA)  suspension 30 mL  30 mL Oral Daily PRN Novella Olive, NP       mupirocin cream (BACTROBAN) 2 %   Topical BID Antonieta Pert, MD   Given at 08/29/20 0804   neomycin-bacitracin-polymyxin (NEOSPORIN) ointment   Topical BID Jaclyn Shaggy, PA-C   1 application at 08/26/20 1713   nicotine (NICODERM CQ - dosed in mg/24 hours) patch 21 mg  21 mg Transdermal Q0600 Novella Olive, NP   21 mg at 08/29/20 0617   OLANZapine zydis (ZYPREXA) disintegrating tablet 10 mg  10 mg Oral Q8H PRN Antonieta Pert, MD   10 mg at 08/24/20 1057   pantoprazole (PROTONIX) EC tablet 20 mg  20 mg Oral Daily Princess Bruins, DO   20 mg at 08/29/20 8099   propranolol (INDERAL) tablet 10 mg  10 mg Oral BID Bartholomew Crews E, MD   10 mg at 08/29/20 8338   risperiDONE (RISPERDAL M-TABS) disintegrating tablet 3 mg  3 mg Oral QHS Bartholomew Crews E, MD   3 mg at 08/28/20 2110   PTA Medications: Medications Prior to Admission  Medication Sig Dispense Refill Last Dose   albuterol (VENTOLIN HFA) 108 (90 Base) MCG/ACT inhaler Inhale 1 puff into the lungs every 6 (six) hours  as needed for wheezing or shortness of breath.      cariprazine (VRAYLAR) 3 MG capsule Take 3 mg by mouth daily. LF 2020 [facility] (Patient not taking: Reported on 08/18/2020)   Not Taking   clonazePAM (KLONOPIN) 1 MG tablet Take 1.5 mg by mouth 2 (two) times daily. LF 2020 per [facility] [place]      ibuprofen (ADVIL) 200 MG tablet Take 400 mg by mouth every 6 (six) hours as needed for mild pain or headache.      Melatonin 10 MG TABS Take 10 mg by mouth at bedtime as needed (sleep). (Patient not taking: Reported on 08/18/2020)   Not Taking   metFORMIN (GLUCOPHAGE-XR) 500 MG 24 hr tablet Take 500 mg by mouth 2 (two) times daily. (Patient not taking: Reported on 08/18/2020)   Not Taking   paliperidone (INVEGA SUSTENNA) 156 MG/ML SUSY injection Inject 156 mg into the muscle one time on 08/26/20. 1 mL 0    paliperidone (INVEGA SUSTENNA) 234  MG/1.5ML SUSY injection Inject 1.5 ml into the muscle 1 time today. 1.5 mL 0     Patient Stressors: Financial difficulties Health problems Legal issue Substance abuse  Patient Strengths: Barrister's clerk for treatment/growth  Treatment Modalities: Medication Management, Group therapy, Case management,  1 to 1 session with clinician, Psychoeducation, Recreational therapy.   Physician Treatment Plan for Primary Diagnosis: Bipolar I disorder, current or most recent episode manic, with psychotic features (HCC) Long Term Goal(s): Improvement in symptoms so as ready for discharge   Short Term Goals: Ability to identify changes in lifestyle to reduce recurrence of condition will improve Ability to verbalize feelings will improve Ability to disclose and discuss suicidal ideas Ability to demonstrate self-control will improve Ability to identify and develop effective coping behaviors will improve Ability to maintain clinical measurements within normal limits will improve Compliance with prescribed medications will improve Ability to identify triggers associated with substance abuse/mental health issues will improve  Medication Management: Evaluate patient's response, side effects, and tolerance of medication regimen.  Therapeutic Interventions: 1 to 1 sessions, Unit Group sessions and Medication administration.  Evaluation of Outcomes: Adequate for Discharge  Physician Treatment Plan for Secondary Diagnosis: Principal Problem:   Bipolar I disorder, current or most recent episode manic, with psychotic features (HCC) Active Problems:   Polysubstance abuse (HCC)   Suicidal ideation  Long Term Goal(s): Improvement in symptoms so as ready for discharge   Short Term Goals: Ability to identify changes in lifestyle to reduce recurrence of condition will improve Ability to verbalize feelings will improve Ability to disclose and discuss suicidal ideas Ability to demonstrate  self-control will improve Ability to identify and develop effective coping behaviors will improve Ability to maintain clinical measurements within normal limits will improve Compliance with prescribed medications will improve Ability to identify triggers associated with substance abuse/mental health issues will improve     Medication Management: Evaluate patient's response, side effects, and tolerance of medication regimen.  Therapeutic Interventions: 1 to 1 sessions, Unit Group sessions and Medication administration.  Evaluation of Outcomes: Adequate for Discharge   RN Treatment Plan for Primary Diagnosis: Bipolar I disorder, current or most recent episode manic, with psychotic features (HCC) Long Term Goal(s): Knowledge of disease and therapeutic regimen to maintain health will improve  Short Term Goals: Ability to remain free from injury will improve, Ability to demonstrate self-control, Ability to participate in decision making will improve, Ability to verbalize feelings will improve, Ability to disclose  and discuss suicidal ideas, and Ability to identify and develop effective coping behaviors will improve  Medication Management: RN will administer medications as ordered by provider, will assess and evaluate patient's response and provide education to patient for prescribed medication. RN will report any adverse and/or side effects to prescribing provider.  Therapeutic Interventions: 1 on 1 counseling sessions, Psychoeducation, Medication administration, Evaluate responses to treatment, Monitor vital signs and CBGs as ordered, Perform/monitor CIWA, COWS, AIMS and Fall Risk screenings as ordered, Perform wound care treatments as ordered.  Evaluation of Outcomes: Adequate for Discharge   LCSW Treatment Plan for Primary Diagnosis: Bipolar I disorder, current or most recent episode manic, with psychotic features (HCC) Long Term Goal(s): Safe transition to appropriate next level of care at  discharge, Engage patient in therapeutic group addressing interpersonal concerns.  Short Term Goals: Engage patient in aftercare planning with referrals and resources, Increase social support, Increase emotional regulation, Facilitate acceptance of mental health diagnosis and concerns, Identify triggers associated with mental health/substance abuse issues, and Increase skills for wellness and recovery  Therapeutic Interventions: Assess for all discharge needs, 1 to 1 time with Social worker, Explore available resources and support systems, Assess for adequacy in community support network, Educate family and significant other(s) on suicide prevention, Complete Psychosocial Assessment, Interpersonal group therapy.  Evaluation of Outcomes: Adequate for Discharge   Progress in Treatment: Attending groups: Yes. Participating in groups: Yes. and No. Taking medication as prescribed: Yes. Toleration medication: Yes. Family/Significant other contact made: Yes, individual(s) contacted:  Father  Patient understands diagnosis: No. Discussing patient identified problems/goals with staff: Yes. Medical problems stabilized or resolved: Yes. Denies suicidal/homicidal ideation: Yes. Issues/concerns per patient self-inventory: No.   New problem(s) identified: No, Describe:  None   New Short Term/Long Term Goal(s): medication stabilization, elimination of SI thoughts, development of comprehensive mental wellness plan.   Patient Goals: "To get better"   Discharge Plan or Barriers: Patient is to follow up with [facility] for therapy and medication management at discharge. Pt is currently homeless.  Reason for Continuation of Hospitalization: Delusions  Medication stabilization  Estimated Length of Stay: 3 to 5 days   Attendees: Patient:  08/29/2020   Physician:  08/29/2020   Nursing:  08/29/2020   RN Care Manager: 08/29/2020   Social Worker: Ruthann Cancer, LCSW 08/29/2020   Recreational Therapist:   08/29/2020   Other:  08/29/2020   Other:  08/29/2020   Other: 08/29/2020     Scribe for Treatment Team: Felizardo Hoffmann, Theresia Majors 08/29/2020 9:50 AM
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
## 2020-08-29 NOTE — Telephone Encounter (Signed)
Spoke with client's father along with client. She asked writer to call her father and see if he could take her to pick up her medication (See CN note)
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
## 2020-08-29 NOTE — [facility] Suicide Risk Assessment (Signed)
[facility] Discharge Suicide Risk Assessment   Principal Problem: Bipolar I disorder, current or most recent episode manic, with psychotic features [facility]) Discharge Diagnoses: Principal Problem:   Bipolar I disorder, current or most recent episode manic, with psychotic features (HCC) Active Problems:   Polysubstance abuse (HCC)   Suicidal ideation  Total Time Spent in Direct Patient Care:  I personally spent 30 minutes on the unit in direct patient care. The direct patient care time included face-to-face time with the patient, reviewing the patient's chart, communicating with other professionals, and coordinating care. Greater than 50% of this time was spent in counseling or coordinating care with the patient regarding goals of hospitalization, psycho-education, and discharge planning needs.  Subjective: Patient seen on rounds and denies SI, HI, AVH, paranoia, ideas of reference, or first rank symptoms. She voices no physical complaints today and requests discharge. Time was spent discussing the need to have recheck as an outpatient of her elevated HR and she was encouraged to continue po hydration. She was advised that her WBC is mildly elevated which may be due to Lithium use and was advised to have a PCP recheck this as an outpatient. She was told to have close monitoring of her creatinine, electrolytes, thyroid, and Lithium level while on Lithium. She was advised that she needs a primary care provider to also recheck her lipids, mild anemia, and to f/u on her wrist pain based on radiograph findings. She was told she will need to continue oral Risperdal while waiting for her Invega sustenna LAI to become therapeutic and that she will need metabolic lab, EKG, CBC, and weight monitoring while on an antipsychotic. Time was given for questions. She voices an aftercare and safety plan and will work with social work about shelter referral at discharge. We discussed continued taper off Neurontin, but she prefers to  stay on her present dose stating she is feeling stable at this time.   Musculoskeletal: Strength & Muscle Tone: within normal limits Gait & Station: normal, steady Patient leans: N/A  Psychiatric Specialty Exam: Physical Exam Vitals reviewed.  HENT:     Head: Normocephalic.  Pulmonary:     Effort: Pulmonary effort is normal.  Neurological:     General: No focal deficit present.     Mental Status: She is alert.   Manual HR at rest 98  Review of Systems - denies CP, SOB, N/V/D  Blood pressure (!) 106/48, pulse (!) 109, temperature 97.9 F (36.6 C), temperature source Oral, resp. rate 16, height 5' (1.524 m), weight 59 kg, SpO2 99 %.Body mass index is 25.39 kg/m.  General Appearance:  casually dressed, adequate hygiene  Eye Contact:  Fair  Speech:  Clear and Coherent and Normal Rate, more spontaneous   Volume:  Normal  Mood:  Euthymic  Affect:   bright, moderate, stable  Thought Process:  Goal Directed and Linear  Orientation:  Full (Time, Place, and Person)  Thought Content:  Logical and denies AVH, paranoia, or delusions and has no evidence of acute psychosis on exam  Suicidal Thoughts:  No  Homicidal Thoughts:  No  Memory:  Recent;   Fair  Judgement:  Fair  Insight:  Fair  Psychomotor Activity:  Normal, no evidence of tremor  Concentration:  Concentration: Fair and Attention Span: Fair  Recall:  [employer] of Knowledge:  Fair  Language:  Fair  Akathisia:  Negative  Assets:  Communication Skills Desire for Improvement Resilience Social Support  ADL's:  Intact  Cognition:  WNL  Sleep:  Number of Hours: 6   Mental Status Per Nursing Assessment::   On Admission:  Self-harm thoughts, delusions, AH  Demographic Factors:  Adolescent or young adult and Caucasian  Loss Factors: Loss of significant relationship, Legal issues, and Financial problems/change in socioeconomic status  Historical Factors: Impulsivity and substance use prior to admission; previous  psychiatric diagnoses/treatments  Risk Reduction Factors:   Positive social support  Continued Clinical Symptoms:  Alcohol/Substance Abuse/Dependencies Previous Psychiatric Diagnoses and Treatments Bipolar diagnosis  Cognitive Features That Contribute To Risk:  Thought constriction (tunnel vision)    Suicide Risk:  Mild:  Suicidal ideation of limited frequency, intensity, duration, and specificity.  There are no identifiable plans, no associated intent, mild dysphoria and related symptoms, and identifiable protective factors, including available and accessible social support.   Follow-up Information     [facility]. Call.   Why: A referral has been made for therapy and medication management services. Contact information: [id number] [place], [place] [address] Hours: Mon-Fri 8AM-5PM  Phone: [phone] Fax: [phone]        [facility] Follow up.   Specialty: Behavioral Health Why: Please go to this provider for therapy and medication management services during walk in hours:  Monday through Wednesday from 8:00 am to 11:00 am.  Services are provided on a first come, first served basis. First appointments at this facility are walk-in. This facility also offers various group therapy options. Contact information: [address] [place] [address] [phone]                Plan Of Care/Follow-up recommendations:  Activity:  as tolerated Diet:  heart healthy Other:  Patient advised to comply with medications and to keep scheduled outpatient follow up appointments. She was advised to drink plenty of fluids and to have a primary care provider or the health  department recheck her elevated heart rate. She was reminded that she needs to stay hydrated while on Lithium and will need ongoing monitoring of her electrolytes, kidney function, and thyroid labs while on Lithium. She was advised that she can  discontinue her oral Risperdal once she gets her next monthly scheduled Invega Sustenna injection. She received Invega Sustenna 234mg  IM on 08/22/20 and Invega sustenna 156mg  IM on 08/26/20 and is due for her next monthly injection on 09/23/20. She was advised to see a primary care provider or the health department without fail for follow-up of the sclerotic density on her wrist radiograph in the event that she needs an outpatient MRI or bone-scan. She was also advised that a primary care provider needs to recheck her elevated white blood cell count, manage her elevated lipids, and recheck her mild anemia without fail. She was reminded she will need ongoing monitoring of her metabolic labs, AIMS, EKG, CBC and weight while on an atypical antipsychotic. She was urged to avoid use of alcohol and illicit substances after discharge.   08/28/20, MD, FAPA 08/29/2020, 8:50 AM

## 2020-08-29 NOTE — SANE Note (Signed)
Follow-up Phone Call  Patient gives verbal consent for a FNE/SANE follow-up phone call in 48-72 hours: PT DID NOT HAVE A CELL PHONE.  ADVISED THAT HER FATHER (DAVID Ransdell) COULD BE CONTACTED, AND THAT A VM COULD BE LEFT WITH HIM [phone]). Patient's telephone number: [phone] Patient gives verbal consent to leave voicemail at the phone number listed above: YES DO NOT CALL between the hours of: N/A    MAILING ADDRESS PER PT (SHE IS HOMELESS):  [address] [place], [place] [address].  [facility] DEPARTMENT EVENT ID #:  [id number] OFFICER:  [facility]  EMS EVENT #:  [id number]   A REFERRAL TO THE [facility] CLINIC WAS MADE ON THE PT'S BEHALF, VIA Epic.  SINCE THE PT WAS HOMELESS, SHE WAS ADVISED THAT IF THE [facility] COULD NOT REACH HER TO SCHEDULE A FOLLOW-UP APPOINTMENT IN 10-14 DAYS THAT SHE SHOULD GO TO THE LOCAL HEALTH DEPARTMENT AND RECEIVE FOLLOW-UP STI TESTING AND PREGNANCY TESTING.  THE PT VERBALIZED HER UNDERSTANDING.  THE PT WAS PROVIDED WITH A PAMPHLET FOR THE [facility] ([facility]), AND SHE SPOKE WITH AN ADVOCATE FROM [facility] ([facility] PRIOR TO HER DISCHARGE.
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
## 2020-08-29 NOTE — Discharge Instructions (Addendum)
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 expiration date. NOTE: This sheet is a  summary. It may not cover all possible information. If you have questions about this medicine, talk to your doctor, pharmacist, or health care provider.  2020 Elsevier/Gold Standard (2018-01-17 06:52:33)      Ulipristal oral tablets-ELLA What is this medicine? ULIPRISTAL (UE li pris tal) is an emergency contraceptive. It prevents pregnancy if taken within 5 days (120 hours) after your regular birth control fails or you have unprotected sex. This medicine will not work if you are already pregnant. This medicine may be used for other purposes; ask your health care provider or pharmacist if you have questions. COMMON BRAND NAME(S): ella What should I tell my health care provider before I take this medicine? They  need to know if you have any of these conditions: liver disease an unusual or allergic reaction to ulipristal, other medicines, foods, dyes, or preservatives pregnant or trying to get pregnant breast-feeding How should I use this medicine? Take this medicine by mouth with or without food. Your doctor may want you to use a quick-response pregnancy test prior to using the tablets. Take your medicine as soon as possible and not more than 5 days (120 hours) after the event. This medicine can be taken at any time during your menstrual cycle. Follow the dose instructions of your health care provider exactly. Contact your health care provider right away if you vomit within 3 hours of taking your medicine to discuss if you need to take another tablet. A patient package insert for the product will be given with each prescription and refill. Read this sheet carefully each time. The sheet may change frequently. Contact your pediatrician regarding the use of this medicine in children. Special care may be needed. Overdosage: If you think you have taken too much of this medicine contact a poison control center or emergency room at once. NOTE: This medicine is only for you. Do not share this medicine with others. What if I miss a dose? This medicine is not for regular use. If you vomit within 3 hours of taking your dose, contact your health care professional for instructions. What may interact with this medicine? This medicine may interact with the following medications: barbiturates such as phenobarbital or primidone birth control pills bosentan carbamazepine certain medicines for fungal infections like griseofulvin, itraconazole, and ketoconazole certain medicines for HIV or AIDS or hepatitis dabigatran digoxin felbamate fexofenadine oxcarbazepine phenytoin rifampin St. John's Wort topiramate This list may not describe all possible interactions. Give your health care provider a list of all the  medicines, herbs, non-prescription drugs, or dietary supplements you use. Also tell them if you smoke, drink alcohol, or use illegal drugs. Some items may interact with your medicine. What should I watch for while using this medicine? Your period may begin a few days earlier or later than expected. If your period is more than 7 days late, pregnancy is possible. See your health care provider as soon as you can and get a pregnancy test. Talk to your healthcare provider before taking this medicine if you know or suspect that you are pregnant. Contact your healthcare provider if you think you may be pregnant and you have taken this medicine. If you have severe abdominal pain about 3 to 5 weeks after taking this medicine, you may have a pregnancy outside the womb, which is called an ectopic or tubal pregnancy. Call your health care provider or go to the nearest emergency room right away if you think this is happening. Discuss birth control options with your health care  provider. Emergency birth control is not to be used routinely to prevent pregnancy. It should not be used more than once in the same cycle. Birth control pills may not work properly while you are taking this medicine. Wait at least 5 days after taking this medicine to start or continue other hormone based birth control. Be sure to use a reliable barrier contraceptive method (such as a condom with spermicide) between the time you take this medicine and your next period. This medicine does not protect you against HIV infection (AIDS) or any other sexually transmitted diseases (STDs). What side effects may I notice from receiving this medicine? Side effects that you should report to your doctor or health care professional as soon as possible: allergic reactions like skin rash, itching or hives, swelling of the face, lips, or tongue Side effects that usually do not require medical attention (report to your doctor or health care professional if they  continue or are bothersome): abdominal pain or cramping dizziness headache nausea spotting tiredness This list may not describe all possible side effects. Call your doctor for medical advice about side effects. You may report side effects to FDA at 1-800-FDA-1088. Where should I keep my medicine? Keep out of the reach of children. Store at between 20 and 25 degrees C (68 and 77 degrees F). Protect from light and keep in the blister card inside the original box until you are ready to take it. Throw away any unused medicine after the expiration date. NOTE: This sheet is a summary. It may not cover all possible information. If you have questions about this medicine, talk to your doctor, pharmacist, or health care provider.  2020 Elsevier/Gold Standard (2016-06-11 14:27:59)

## 2020-08-29 NOTE — Telephone Encounter (Signed)
Called pharmacy to see if medications were ready for pick up. All medications are ready except Risperdal. It will be in on Monday after 2:00.

## 2020-08-29 NOTE — ED Notes (Signed)
Patient changed into hospital gown by ED Tech. All belongings placed in brown paper bag.

## 2020-08-29 NOTE — SANE Note (Signed)
On 08/29/2020, at approximately 2015 hours, the SANE/FNE Teacher, music) consult was completed. The primary RN and provider have been notified. Please contact the SANE/FNE nurse on call (listed in Amion) with any further concerns.

## 2020-08-29 NOTE — Progress Notes (Signed)
Pulse 109, BP 106/48.  Provided pt with gatorade and encourage her to drink.  Will continue to monitor.
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
## 2020-08-29 NOTE — Progress Notes (Signed)
Recreation Therapy Notes  Date:  7.22.22 Time: 0930 Location: [address] Dayroom  Group Topic: Stress Management  Goal Area(s) Addresses:  Patient will identify positive stress management techniques. Patient will identify benefits of using stress management post d/c.  Behavioral Response: None  Intervention: Stress Management  Activity :  Meditation.  LRT played a meditation that focused on setting personal boundaries even if that makes others upset or uncomfortable.  Education:  Stress Management, Discharge Planning.   Education Outcome: Acknowledges Education  Clinical Observations/Feedback: Pt stayed for explanation of what group entailed but left right after.  Pt did not return.    Caroll Rancher, LRT/CTRS         Caroll Rancher A 08/29/2020 11:46 AM

## 2020-08-29 NOTE — SANE Note (Signed)
Patient spoke with the [facility] of the [employer], and they advised that she could stay at the [facility] (in [place] tonight).  The patient will need a bus pass/taxi voucher, upon discharge, in order to get to that location.

## 2020-08-29 NOTE — Telephone Encounter (Signed)
Case manager/left message for return call

## 2020-08-29 NOTE — Telephone Encounter (Signed)
Spoke with pharmacist Sharyl Nimrod. [facility] can provide a three day supply of Risperdal until it is ready at [facility] on Monday.
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
## 2020-08-29 NOTE — Progress Notes (Signed)
Adult Psychoeducational Group Note  Date:  08/29/2020 Time:  11:03 AM  Group Topic/Focus:  Goals Group:   The focus of this group is to help patients establish daily goals to achieve during treatment and discuss how the patient can incorporate goal setting into their daily lives to aide in recovery.  Participation Level:  Did Not Attend Deforest Hoyles [facility] 08/29/2020, 11:03 AM

## 2020-09-01 ENCOUNTER — Telehealth: Payer: Self-pay | Admitting: *Deleted

## 2020-09-01 NOTE — Telephone Encounter (Signed)
Received call from [place] at [facility] medical office. Client's father is requesting a call back.
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
## 2022-05-16 IMAGING — DX DG WRIST COMPLETE 3+V*R*
4 series · 4 of 4 positions shown · non-contrast
Comparison: None.

CLINICAL DATA: Right wrist pain after fall last night.

EXAM:
RIGHT WRIST - COMPLETE 3+ VIEW

[wrist pa]
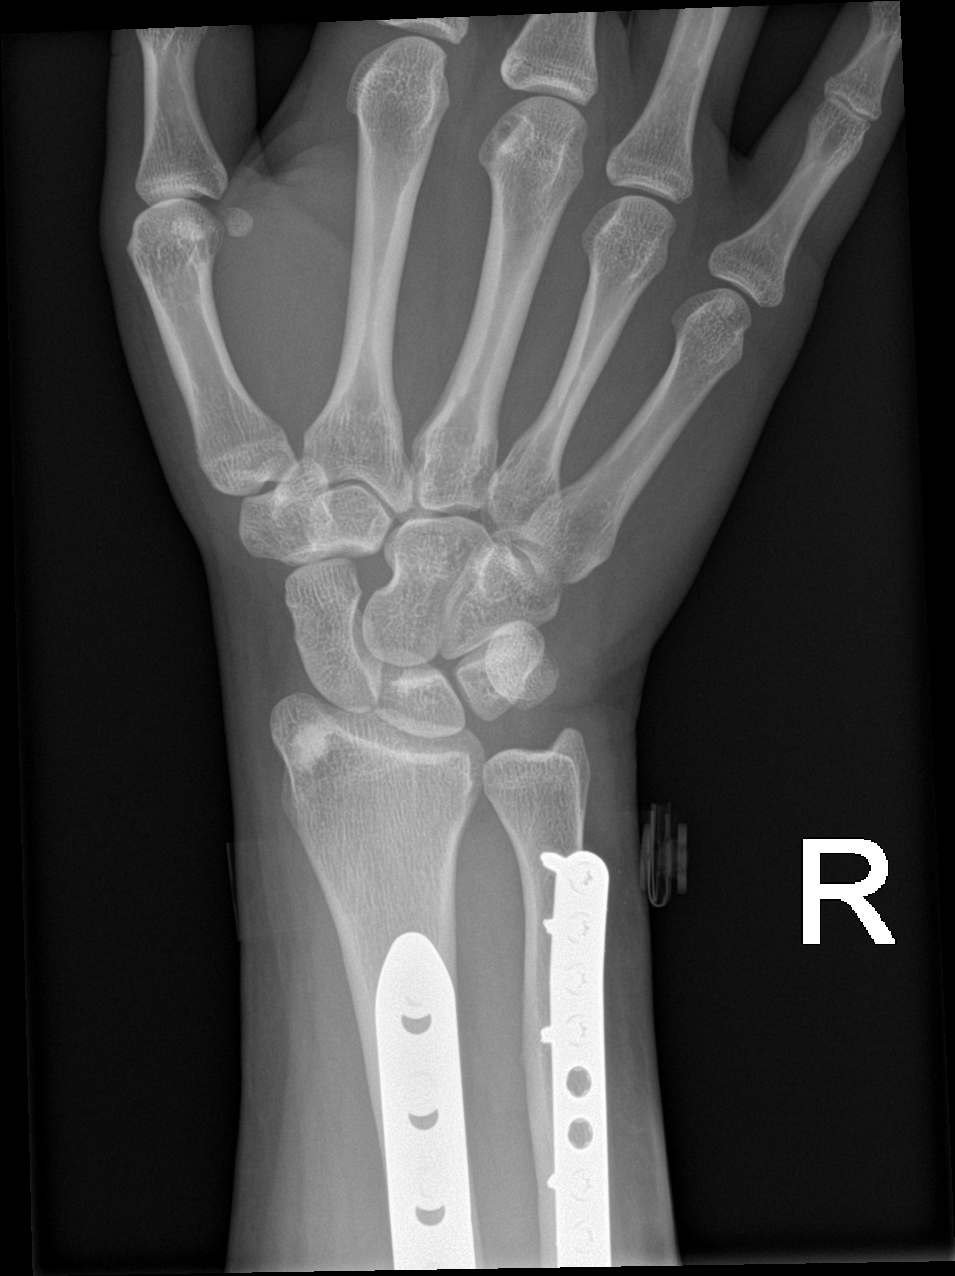

[wrist obl]
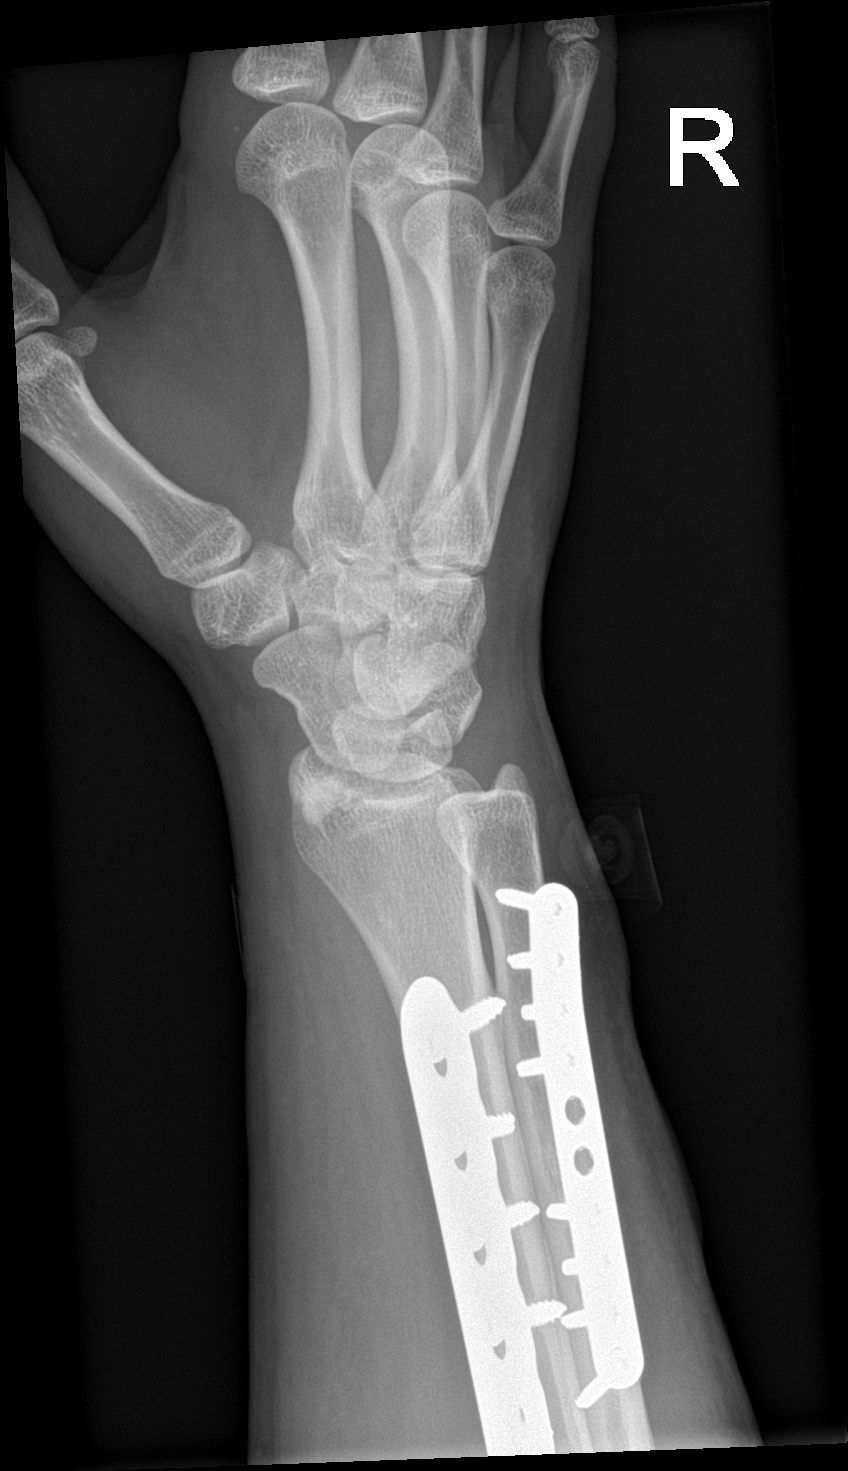

[wrist lat]
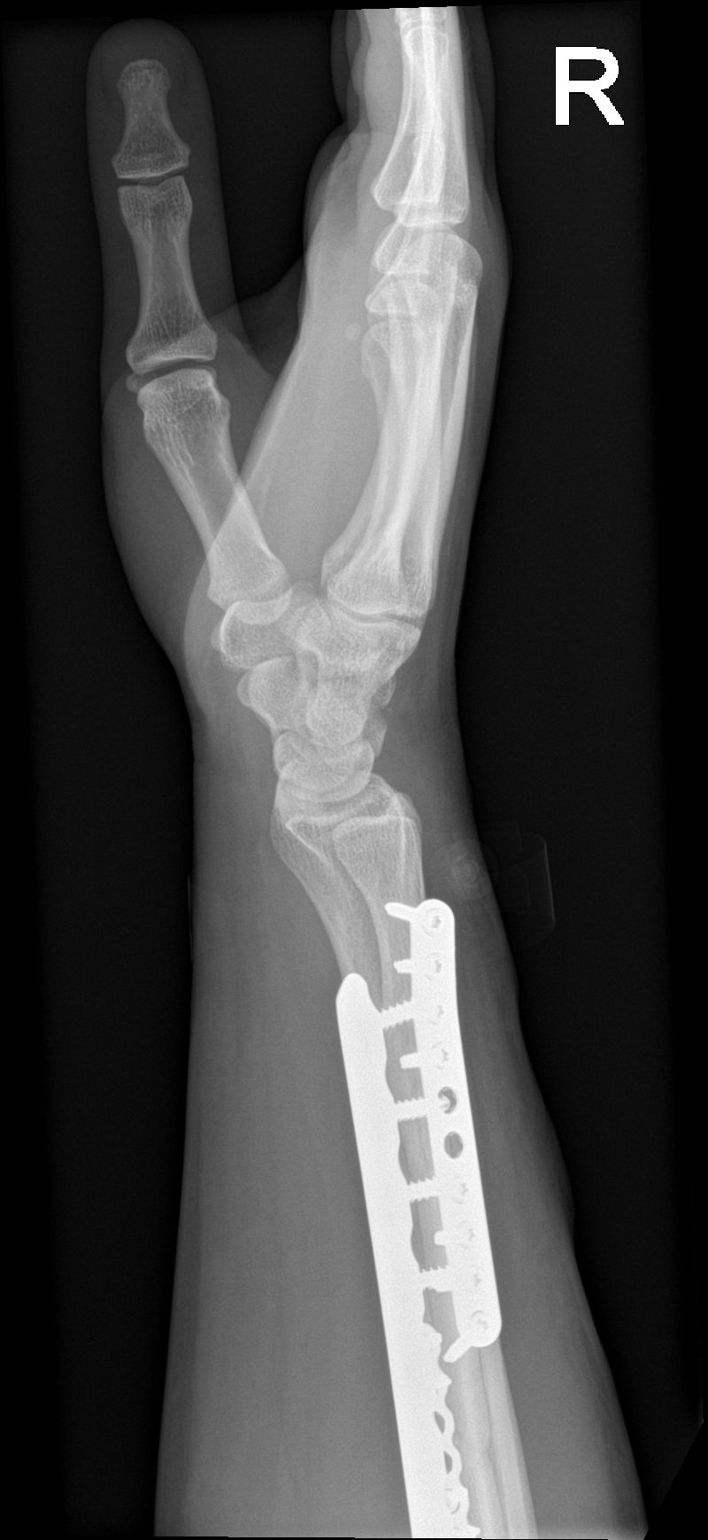

[wrist navicular]
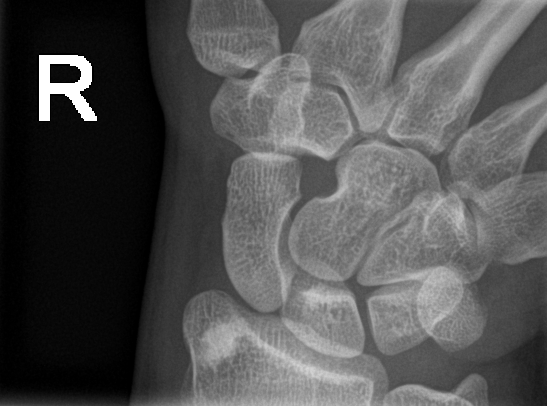

[4 of 4 positions shown; findings below may reference images not displayed]

FINDINGS: Status post surgical internal fixation of old distal right radial
and ulnar fractures. No acute fracture or dislocation is noted. No
definite soft tissue abnormality is noted. Sclerotic density is
noted in ulnar styloid region which may be posttraumatic in
etiology.
IMPRESSION: Status post surgical fixation of old distal right radial and ulnar
fractures. No acute fracture or dislocation is noted. Sclerotic
density is noted in ulnar styloid region which may be posttraumatic
in etiology, but if patient is persistently symptomatic in this
area, further evaluation with bone scan or MRI is recommended to
rule out possible neoplasm.
